# Patient Record
Sex: Male | Born: 1965 | Race: White | Hispanic: No | Marital: Married | Smoking: Never smoker
Health system: Southern US, Community
[De-identification: ages and names within clinical notes are randomized; demographics above are authoritative.]

## PROBLEM LIST (undated history)

## (undated) DIAGNOSIS — L80 Vitiligo: Secondary | ICD-10-CM

## (undated) HISTORY — DX: Vitiligo: L80

---

## 2007-06-03 ENCOUNTER — Encounter: Payer: Self-pay | Admitting: Family Medicine

## 2007-06-08 ENCOUNTER — Ambulatory Visit: Payer: Self-pay | Admitting: Family Medicine

## 2007-06-08 DIAGNOSIS — M25579 Pain in unspecified ankle and joints of unspecified foot: Secondary | ICD-10-CM

## 2007-06-08 DIAGNOSIS — L80 Vitiligo: Secondary | ICD-10-CM

## 2007-09-06 ENCOUNTER — Ambulatory Visit: Payer: Self-pay | Admitting: Family Medicine

## 2007-09-24 ENCOUNTER — Ambulatory Visit: Payer: Self-pay | Admitting: Family Medicine

## 2007-09-30 LAB — CONVERTED CEMR LAB
AST: 20 units/L (ref 0–37)
Basophils Absolute: 0 10*3/uL (ref 0.0–0.1)
Basophils Relative: 0.2 % (ref 0.0–1.0)
Bilirubin, Direct: 0.1 mg/dL (ref 0.0–0.3)
CO2: 33 meq/L — ABNORMAL HIGH (ref 19–32)
Calcium: 9.4 mg/dL (ref 8.4–10.5)
Chloride: 106 meq/L (ref 96–112)
Direct LDL: 136.9 mg/dL
Eosinophils Relative: 1.9 % (ref 0.0–5.0)
GFR calc non Af Amer: 88 mL/min
Glucose, Bld: 92 mg/dL (ref 70–99)
Hemoglobin: 14.9 g/dL (ref 13.0–17.0)
Monocytes Absolute: 0.4 10*3/uL (ref 0.2–0.7)
Monocytes Relative: 8.1 % (ref 3.0–11.0)
Neutrophils Relative %: 50.4 % (ref 43.0–77.0)
Platelets: 244 10*3/uL (ref 150–400)
RDW: 12.8 % (ref 11.5–14.6)
Total Bilirubin: 0.8 mg/dL (ref 0.3–1.2)
Total Protein: 6.7 g/dL (ref 6.0–8.3)

## 2008-08-11 ENCOUNTER — Ambulatory Visit: Payer: Self-pay | Admitting: Family Medicine

## 2008-08-11 ENCOUNTER — Telehealth (INDEPENDENT_AMBULATORY_CARE_PROVIDER_SITE_OTHER): Payer: Self-pay | Admitting: *Deleted

## 2008-08-11 DIAGNOSIS — M79609 Pain in unspecified limb: Secondary | ICD-10-CM

## 2008-09-22 ENCOUNTER — Encounter: Admission: RE | Admit: 2008-09-22 | Discharge: 2008-10-21 | Payer: Self-pay | Admitting: Sports Medicine

## 2009-01-19 ENCOUNTER — Telehealth (INDEPENDENT_AMBULATORY_CARE_PROVIDER_SITE_OTHER): Payer: Self-pay | Admitting: *Deleted

## 2009-01-26 ENCOUNTER — Ambulatory Visit: Payer: Self-pay | Admitting: Family Medicine

## 2009-02-03 ENCOUNTER — Encounter (INDEPENDENT_AMBULATORY_CARE_PROVIDER_SITE_OTHER): Payer: Self-pay | Admitting: *Deleted

## 2009-04-02 ENCOUNTER — Ambulatory Visit: Payer: Self-pay | Admitting: Family Medicine

## 2009-04-02 LAB — CONVERTED CEMR LAB
Blood in Urine, dipstick: NEGATIVE
Ketones, urine, test strip: NEGATIVE
Protein, U semiquant: 30
Specific Gravity, Urine: 1.02
WBC Urine, dipstick: NEGATIVE
pH: 6.5

## 2010-01-24 IMAGING — CR DG FOOT COMPLETE 3+V*L*
3 series · 3 of 3 positions shown · non-contrast
Comparison: None

CLINICAL DATA: Lateral pain

LEFT FOOT - COMPLETE 3+ VIEW

[view not recorded (1 of 3)]
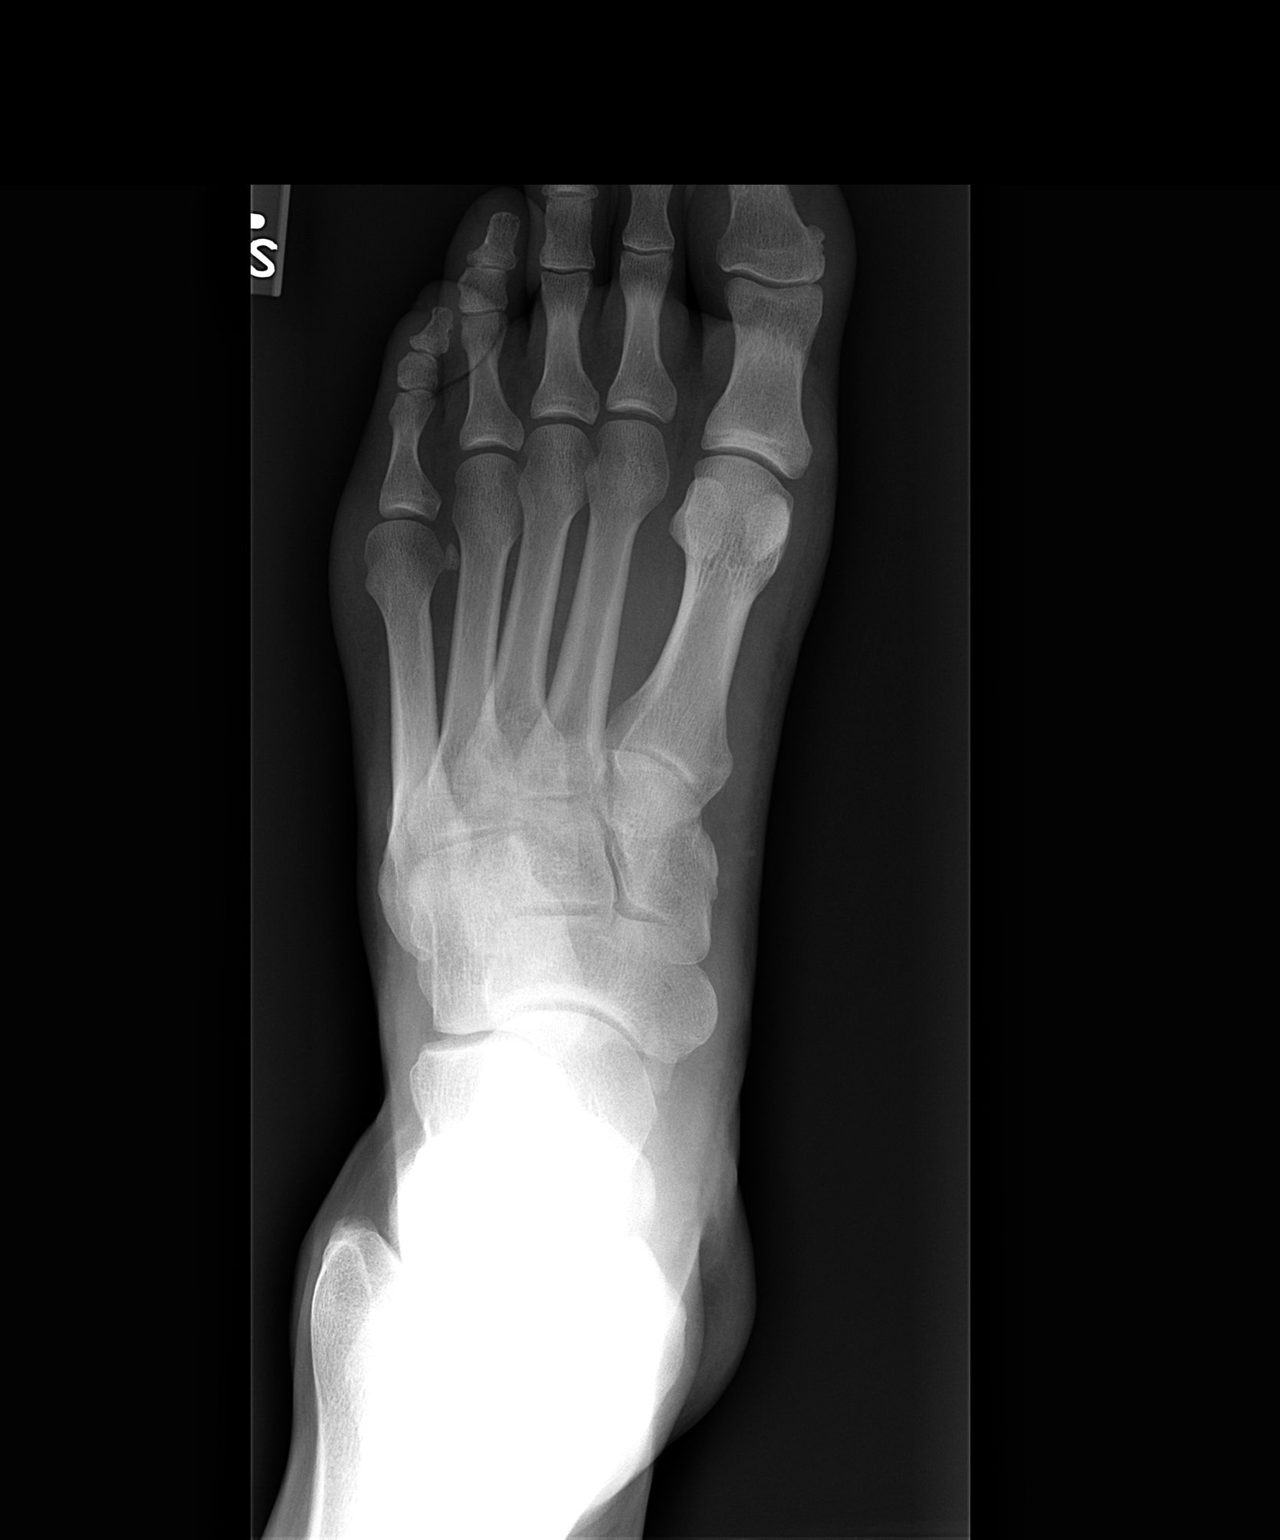

[view not recorded (2 of 3)]
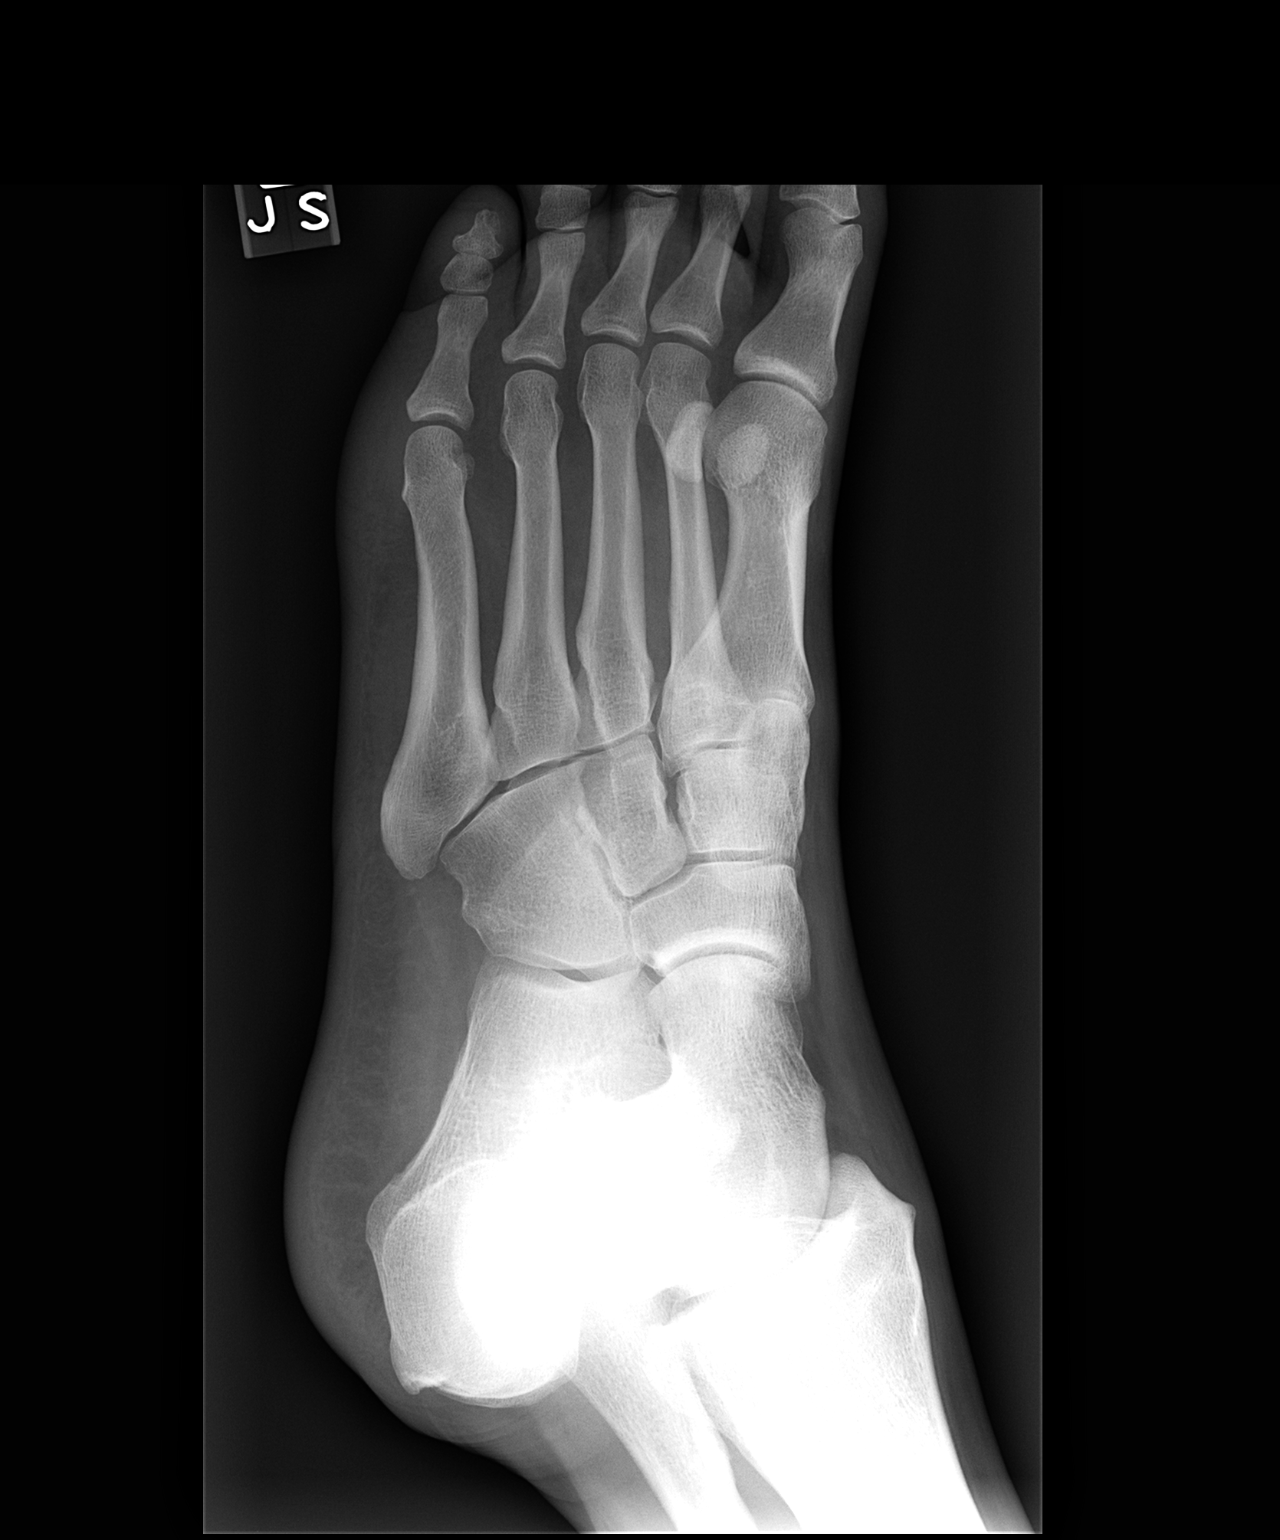

[view not recorded (3 of 3)]
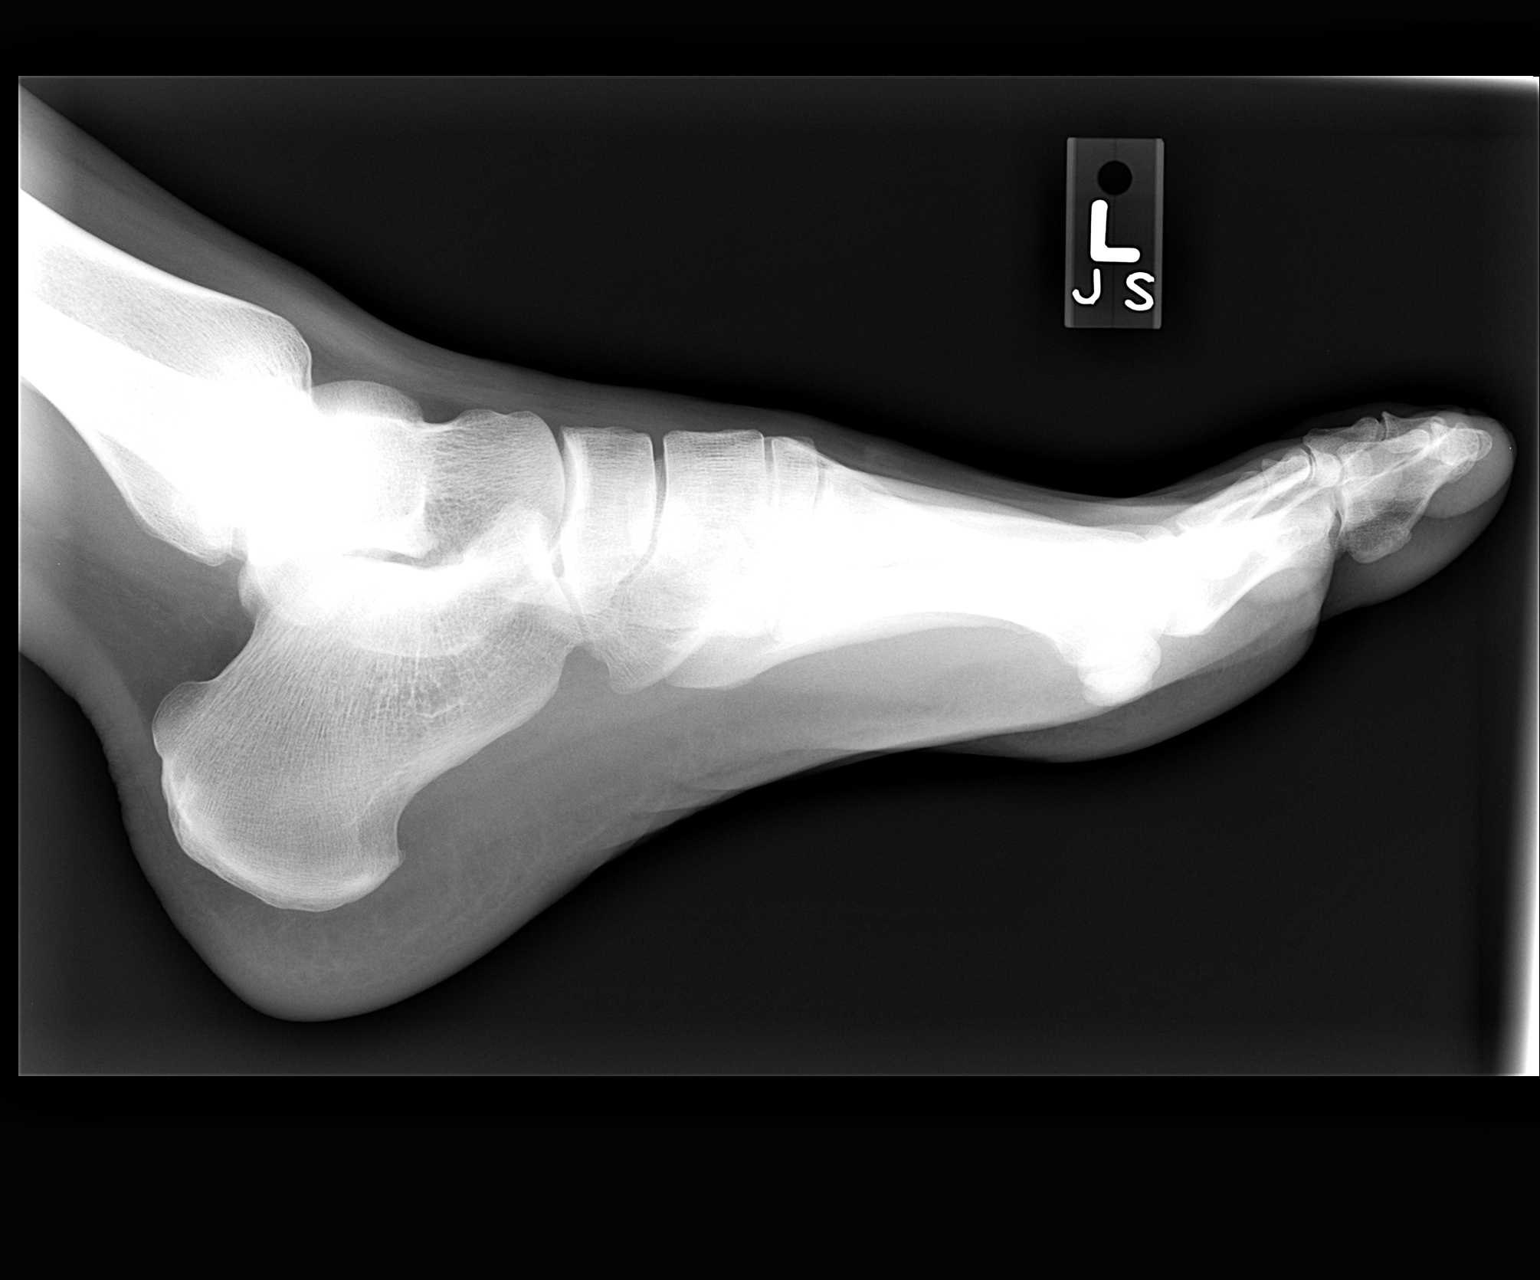

[3 of 3 positions shown; findings below may reference images not displayed]

FINDINGS: There is no evidence of fracture or dislocation.  There
is no evidence of arthropathy or other focal bone abnormality.
Soft tissues are unremarkable.
IMPRESSION: Negative.

## 2010-01-24 IMAGING — CR DG ANKLE COMPLETE 3+V*R*
3 series · 3 of 3 positions shown · non-contrast
Comparison: None

CLINICAL DATA: Medial right ankle pain

RIGHT ANKLE - COMPLETE 3+ VIEW

[view not recorded (1 of 3)]
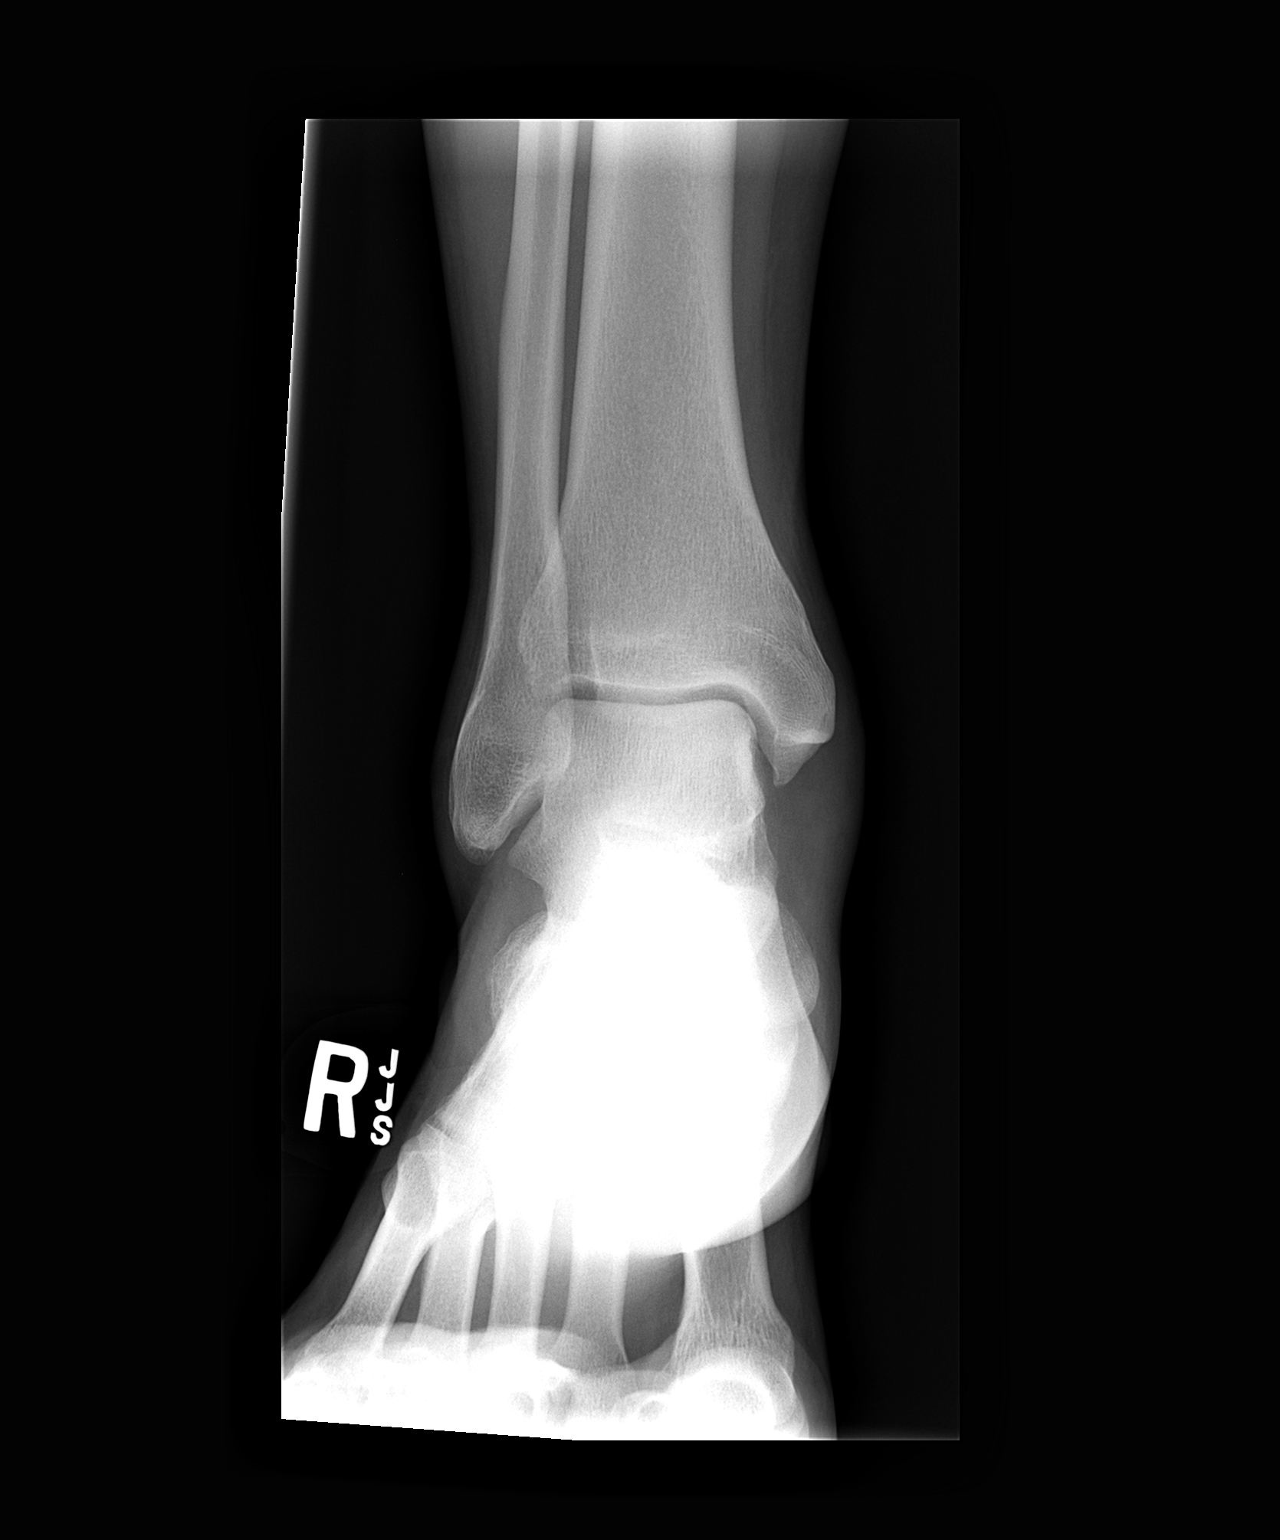

[view not recorded (2 of 3)]
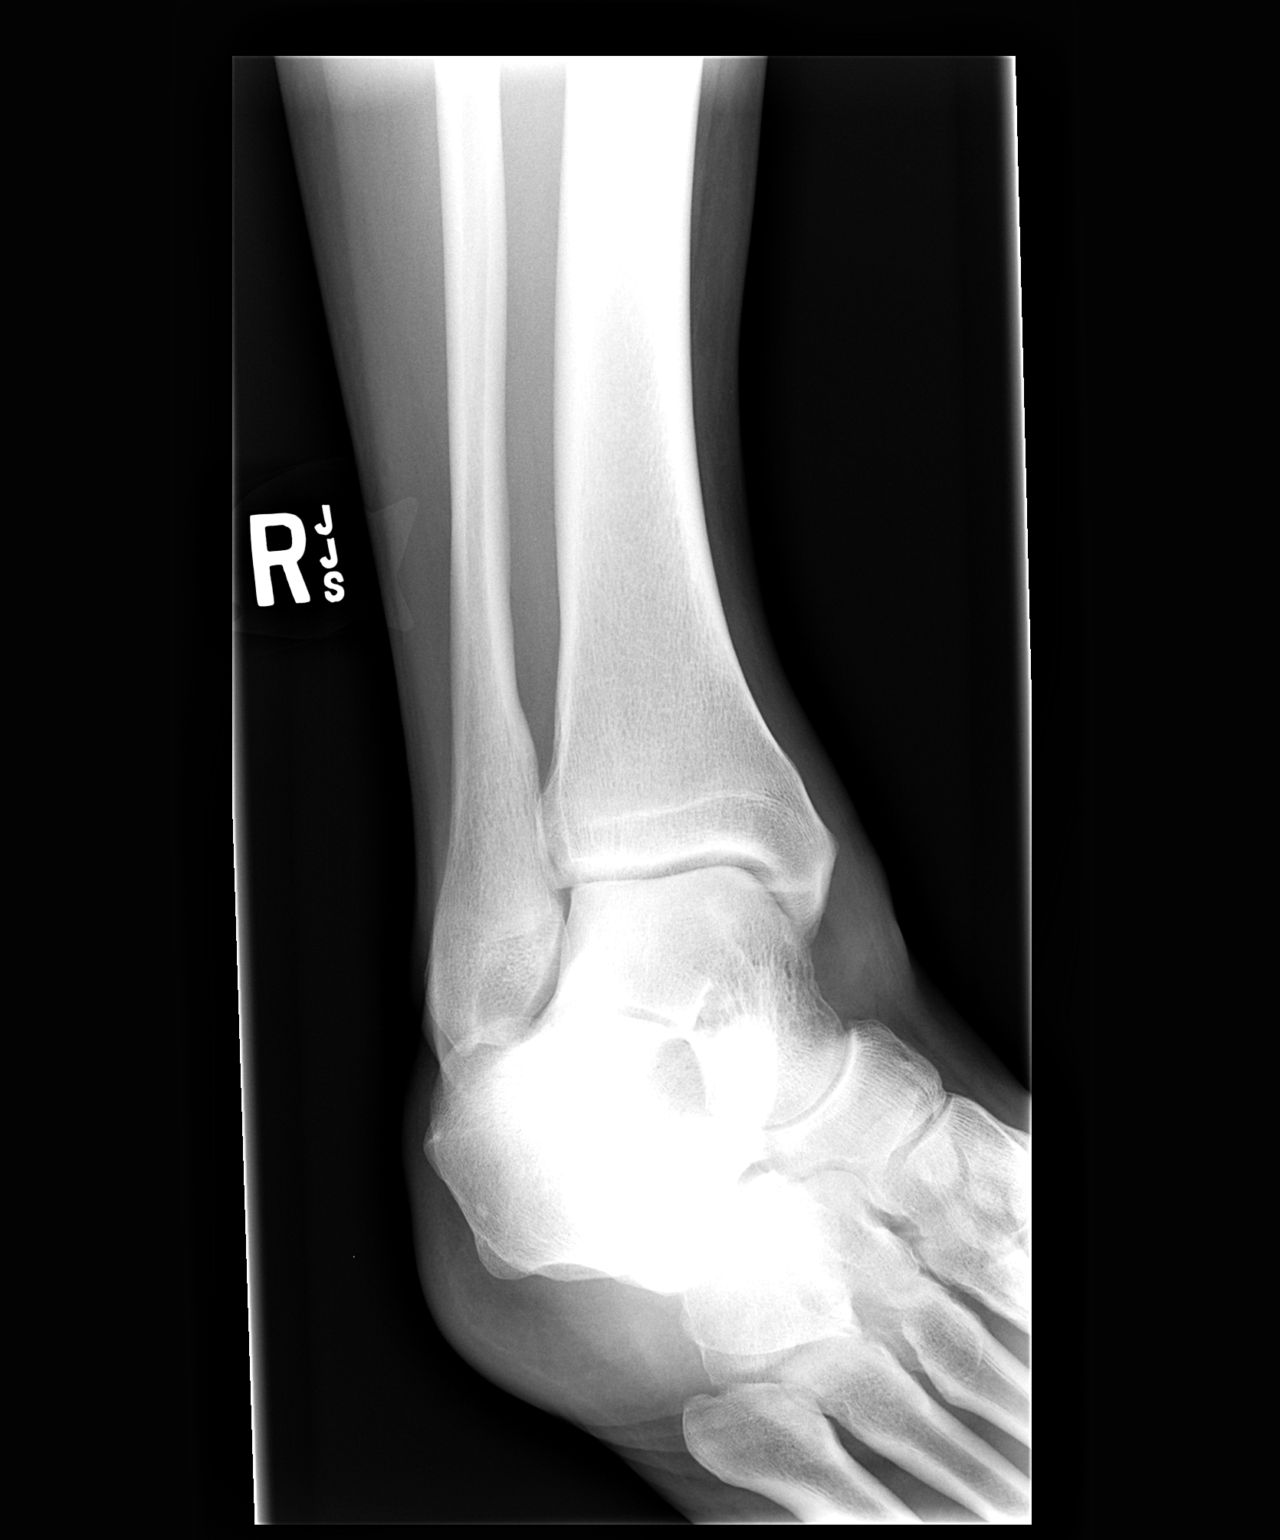

[view not recorded (3 of 3)]
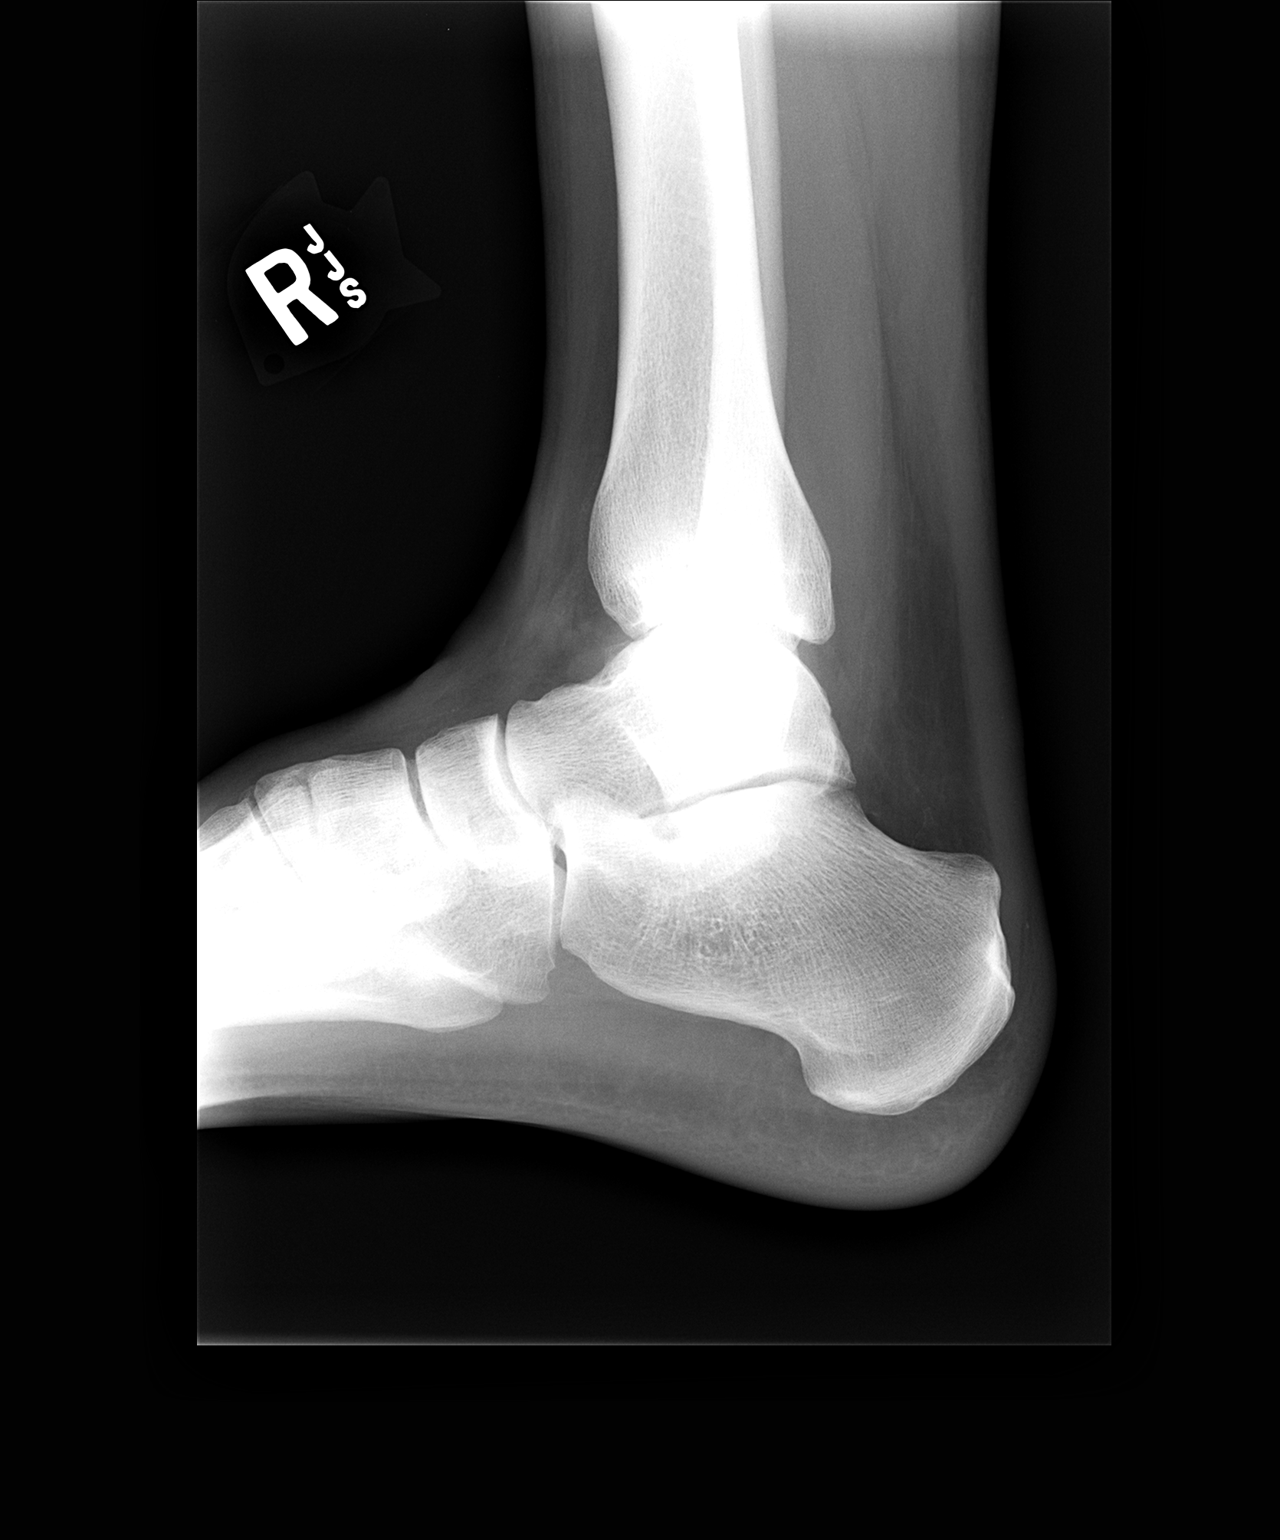

[3 of 3 positions shown; findings below may reference images not displayed]

FINDINGS: There is no evidence of fracture, dislocation or joint
effusion.  There is no evidence of arthropathy or other focal bone
abnormality.  Soft tissues are unremarkable.
IMPRESSION: Negative.

## 2010-08-20 ENCOUNTER — Ambulatory Visit: Payer: Self-pay | Admitting: Family Medicine

## 2010-08-20 ENCOUNTER — Encounter: Payer: Self-pay | Admitting: Family Medicine

## 2010-08-20 DIAGNOSIS — R002 Palpitations: Secondary | ICD-10-CM

## 2010-08-23 LAB — CONVERTED CEMR LAB
AST: 24 units/L (ref 0–37)
Alkaline Phosphatase: 53 units/L (ref 39–117)
BUN: 16 mg/dL (ref 6–23)
Bilirubin, Direct: 0.1 mg/dL (ref 0.0–0.3)
Cholesterol: 198 mg/dL (ref 0–200)
Creatinine, Ser: 0.8 mg/dL (ref 0.4–1.5)
GFR calc non Af Amer: 108.89 mL/min (ref >60)
Glucose, Bld: 83 mg/dL (ref 70–99)
LDL Cholesterol: 125 mg/dL — ABNORMAL HIGH (ref 0–99)
Lymphocytes Relative: 30.8 % (ref 12.0–46.0)
MCV: 99.9 fL (ref 78.0–100.0)
Monocytes Absolute: 0.5 10*3/uL (ref 0.1–1.0)
Neutro Abs: 3.2 10*3/uL (ref 1.4–7.7)
Total Bilirubin: 1.2 mg/dL (ref 0.3–1.2)
Total Protein: 6.8 g/dL (ref 6.0–8.3)
VLDL: 9.8 mg/dL (ref 0.0–40.0)

## 2010-08-25 ENCOUNTER — Encounter: Payer: Self-pay | Admitting: Family Medicine

## 2010-08-25 ENCOUNTER — Ambulatory Visit: Payer: Self-pay

## 2010-08-25 ENCOUNTER — Ambulatory Visit: Payer: Self-pay | Admitting: Cardiology

## 2010-08-25 ENCOUNTER — Ambulatory Visit (HOSPITAL_COMMUNITY): Admission: RE | Admit: 2010-08-25 | Discharge: 2010-08-25 | Payer: Self-pay | Admitting: Family Medicine

## 2010-08-27 ENCOUNTER — Ambulatory Visit: Payer: Self-pay | Admitting: Cardiology

## 2010-11-23 NOTE — Assessment & Plan Note (Signed)
Summary: 6 months of irregular heartbeat at night/no pain/kn   Vital Signs:  Patient profile:   45 year old male Height:      72 inches Weight:      208.0 pounds BMI:     28.31 Pulse rate:   56 / minute Pulse rhythm:   regular BP sitting:   130 / 82  (right arm) Cuff size:   regular  Vitals Entered By: Almeta Monas CMA Duncan Dull) (August 20, 2010 9:29 AM) CC: c/o irregular heartbeat x 6mos   History of Present Illness: Pt here c/o greater than 6 months of irregular hearbeat that usually last about 15 -20 minutes but last night it lasted longer and he felt a "thud" when it because regular again.      Current Medications (verified): 1)  Mobic 15 Mg Tabs (Meloxicam) .Marland Kitchen.. 1 By Mouth Once Daily As Needed  Allergies (verified): No Known Drug Allergies  Past History:  Past Medical History: Last updated: 2007-09-09 Viteligo + PPD  Past Surgical History: Last updated: 06/08/2007 Denies surgical history  Family History: Last updated: Sep 09, 2007 MOTHER:DECEASED-BRAIN ANURESYUM FATHER;LIVING 1 SISTER:LIVING Family History Hypertension:MOTHER, SISTER CANCER:AUNT-MOTHER'S SIDE  Family History Depression: MOTHER, SISTER? PGM--old age,  Viviann Spare---- 44 yo  Social History: Last updated: 06/08/2007 Occupation: Investment banker, operational at [employer]  Married with step son Never Smoked Alcohol use-yes Drug use-no Regular exercise-yes  Risk Factors: Caffeine Use: 5+ (06/08/2007) Exercise: yes (06/08/2007)  Risk Factors: Smoking Status: never (06/08/2007) Passive Smoke Exposure: no (06/08/2007)  Family History: Reviewed history from 09-Sep-2007 and no changes required. MOTHER:DECEASED-BRAIN ANURESYUM FATHER;LIVING 1 SISTER:LIVING Family History Hypertension:MOTHER, SISTER CANCER:AUNT-MOTHER'S SIDE  Family History Depression: MOTHER, SISTER? PGM--old age,  Viviann Spare---- 44 yo  Social History: Reviewed history from 06/08/2007 and no changes required. Occupation: Investment banker, operational at [employer]  Married  with step son Never Smoked Alcohol use-yes Drug use-no Regular exercise-yes  Review of Systems      See HPI  Physical Exam  General:  Well-developed,well-nourished,in no acute distress; alert,appropriate and cooperative throughout examination Neck:  No deformities, masses, or tenderness noted. Lungs:  Normal respiratory effort, chest expands symmetrically. Lungs are clear to auscultation, no crackles or wheezes. Heart:  normal rate and no murmur.   Psych:  Oriented X3 and normally interactive.     Impression & Recommendations:  Problem # 1:  PALPITATIONS, RECURRENT (ICD-785.1)  Orders: Venipuncture (01027) TLB-BMP (Basic Metabolic Panel-BMET) (80048-METABOL) TLB-CBC Platelet - w/Differential (85025-CBCD) TLB-TSH (Thyroid Stimulating Hormone) (84443-TSH) TLB-Lipid Panel (80061-LIPID) TLB-Hepatic/Liver Function Pnl (80076-HEPATIC) Echo Referral (Echo) Cardiology Referral (Cardiology) EKG w/ Interpretation (93000)  Avoid caffeine, chocolate, and stimulants. Stress reduction as well as medication options discussed.   Complete Medication List: 1)  Mobic 15 Mg Tabs (Meloxicam) .Marland Kitchen.. 1 by mouth once daily as needed  Patient Instructions: 1)  If symptoms reoccur go to er   Orders Added: 1)  Venipuncture [36415] 2)  TLB-BMP (Basic Metabolic Panel-BMET) [80048-METABOL] 3)  TLB-CBC Platelet - w/Differential [85025-CBCD] 4)  TLB-TSH (Thyroid Stimulating Hormone) [84443-TSH] 5)  TLB-Lipid Panel [80061-LIPID] 6)  TLB-Hepatic/Liver Function Pnl [80076-HEPATIC] 7)  Echo Referral [Echo] 8)  Cardiology Referral [Cardiology] 9)  Est. Patient Level IV [25366] 10)  EKG w/ Interpretation [93000]

## 2010-11-23 NOTE — Assessment & Plan Note (Signed)
Summary: np6/ echo on 11/2 / recurrent palp. pt has atena. gd   Primary Cody Castro:  Cody Castro  CC:  referal from Dr. Laury Castro palpitations..pt states he has been doing ok since that visit.  History of Present Illness: 45 year old male for evaluation of palpitations. Echocardiogram in November of 2011 showed normal LV function and no significant valvular disease. There was mild biatrial enlargement. TSH and potassium normal in October of 2011. Over the past one year patient has noticed occasional palpitations. They are described as a heartbeat followed by a pause. He does not have sustained palpitations. He otherwise denies dyspnea on exertion, orthopnea, PND, pedal edema, syncope or chest pain. Because of the above we were asked to further evaluate.  Current Medications (verified): 1)  None  Allergies: No Known Drug Allergies  Past History:  Past Medical History: POSITIVE PPD  VITILIGO  Prior duodenal ulcer  Past Surgical History: Reviewed history from 06/08/2007 and no changes required. Denies surgical history  Family History: Reviewed history from 09/06/2007 and no changes required. MOTHER:DECEASED-BRAIN ANURESYUM FATHER;LIVING 1 SISTER:LIVING Family History Hypertension:MOTHER, SISTER CANCER:AUNT-MOTHER'S SIDE  Family History Depression: MOTHER, SISTER? PGM--old age,  Cody Castro---- 8 yo  No premature CAD  Social History: Reviewed history from 06/08/2007 and no changes required. 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 Remaining systems are negative.   Vital Signs:  Patient profile:   45 year old male Height:      72 inches Weight:      204 pounds BMI:     27.77 Pulse rate:   57 /  minute Resp:     14 per minute BP sitting:   113 / 76  (left arm)  Vitals Entered By: Cody Castro (August 27, 2010 3:30 PM)  Physical Exam  General:  Well developed/well nourished in NAD Skin warm/dry Patient not depressed No peripheral clubbing Back-normal HEENT-normal/normal eyelids Neck supple/normal carotid upstroke bilaterally; no bruits; no JVD; no thyromegaly chest - CTA/ normal expansion CV - RRR/normal S1 and S2; no murmurs, rubs or gallops;  PMI nondisplaced Abdomen -NT/ND, no HSM, no mass, + bowel sounds, no bruit 2+ femoral pulses, no bruits Ext-no edema, chords, 2+ DP Neuro-grossly nonfocal     EKG  Procedure date:  08/20/2010  Findings:      Sinus bradycardia with no ST changes.  Impression & Recommendations:  Problem # 1:  PALPITATIONS, RECURRENT (ICD-785.1) Symptoms sound most consistent with PACs or PVCs. LV function and TSH normal. They are self-limited at this point. If they worsen in the future we could consider a monitor and if above documented a beta blocker for symptomatic relief. However he does not wish to pursue this at this point. I have asked him to decrease his caffeine and see if this will improve his symptoms.

## 2010-11-23 NOTE — Miscellaneous (Signed)
Summary: Appointment Canceled  Appointment status changed to canceled by [employer] on 08/20/2010 10:29 AM.  Cancellation Comments --------------------- echo/ recurrent palp. pt has atena/ gd  Appointment Information ----------------------- Appt Type:  CARDIOLOGY ANCILLARY VISIT      Date:  Wednesday, August 25, 2010      Time:  1:00 PM for 60 min   Urgency:  Routine   Made By:  Hoy Finlay Scheduler  To Visit:  [id number]    Reason:  echo/ recurrent palp. pt has atena/ gd  Appt Comments ------------- -- 08/20/10 10:29: (CEM[facility]) CANCELED -- echo/ recurrent palp. pt has atena/ gd -- 08/20/10 10:16: ([facility]EM[facility]) BOOKED -- Routine CARDIOLOGY ANCILLARY VISIT at 08/25/2010 1:00 PM for 60 min echo/ recurrent palp. pt has atena/ gd

## 2010-11-23 NOTE — Miscellaneous (Signed)
Summary: Appointment Canceled  Appointment status changed to canceled by [employer] on 08/20/2010 10:32 AM.  Cancellation Comments --------------------- echo/ recurrent palp. pt has atena/ gd  Appointment Information ----------------------- Appt Type:  CARDIOLOGY ANCILLARY VISIT      Date:  Monday, August 30, 2010      Time:  4:00 PM for 60 min   Urgency:  Routine   Made By:  Pearson Grippe  To Visit:  [id number]    Reason:  echo/ recurrent palp. pt has atena/ gd  Appt Comments ------------- -- 08/20/10 10:32: (CEMR) CANCELED -- echo/ recurrent palp. pt has atena/ gd -- 08/20/10 10:29: ([facility]) BOOKED -- Routine CARDIOLOGY ANCILLARY VISIT at 08/30/2010 4:00 PM for 60 min echo/ recurrent palp. pt has atena/ gd

## 2010-11-23 NOTE — Miscellaneous (Signed)
Summary: Appointment Canceled  Appointment status changed to canceled by [employer] on 08/20/2010 10:39 AM.  Cancellation Comments --------------------- echo/ recurrent palp. pt has atena/ gd  Appointment Information ----------------------- Appt Type:  CARDIOLOGY ANCILLARY VISIT      Date:  Wednesday, August 25, 2010      Time:  3:00 PM for 60 min   Urgency:  Routine   Made By:  Hoy Finlay Scheduler  To Visit:  [id number]    Reason:  echo/ recurrent palp. pt has atena/ gd  Appt Comments ------------- -- 08/20/10 10:39: (CEMR) CANCELED -- echo/ recurrent palp. pt has atena/ gd -- 08/20/10 10:32: (CEM[facility]) BOOKED -- Routine CARDIOLOGY ANCILLARY VISIT at 08/25/2010 3:00 PM for 60 min echo/ recurrent palp. pt has atena/ gd

## 2011-11-15 ENCOUNTER — Encounter: Payer: Self-pay | Admitting: Family Medicine

## 2011-11-15 ENCOUNTER — Ambulatory Visit (INDEPENDENT_AMBULATORY_CARE_PROVIDER_SITE_OTHER): Payer: 59 | Admitting: Family Medicine

## 2011-11-15 VITALS — BP 120/72 | HR 57 | Temp 97.8°F | Ht 72.0 in | Wt 219.4 lb

## 2011-11-15 DIAGNOSIS — Z0000 Encounter for general adult medical examination without abnormal findings: Secondary | ICD-10-CM

## 2011-11-15 DIAGNOSIS — J329 Chronic sinusitis, unspecified: Secondary | ICD-10-CM

## 2011-11-15 LAB — POCT URINALYSIS DIPSTICK
Leukocytes, UA: NEGATIVE
Protein, UA: NEGATIVE
Urobilinogen, UA: 0.2

## 2011-11-15 LAB — HEPATIC FUNCTION PANEL
AST: 17 U/L (ref 0–37)
Albumin: 4 g/dL (ref 3.5–5.2)
Alkaline Phosphatase: 56 U/L (ref 39–117)
Total Bilirubin: 0.8 mg/dL (ref 0.3–1.2)

## 2011-11-15 LAB — BASIC METABOLIC PANEL
CO2: 28 mEq/L (ref 19–32)
Calcium: 9.1 mg/dL (ref 8.4–10.5)
GFR: 105.3 mL/min (ref >60.00)
Glucose, Bld: 94 mg/dL (ref 70–99)
Sodium: 139 mEq/L (ref 135–145)

## 2011-11-15 LAB — CBC WITH DIFFERENTIAL/PLATELET
Basophils Absolute: 0 10*3/uL (ref 0.0–0.1)
Hemoglobin: 14.4 g/dL (ref 13.0–17.0)
Lymphocytes Relative: 35.6 % (ref 12.0–46.0)
Monocytes Relative: 8.6 % (ref 3.0–12.0)
Neutro Abs: 2.8 10*3/uL (ref 1.4–7.7)
RBC: 4.31 Mil/uL (ref 4.22–5.81)
RDW: 13.1 % (ref 11.5–14.6)
WBC: 5.3 10*3/uL (ref 4.5–10.5)

## 2011-11-15 LAB — LIPID PANEL
LDL Cholesterol: 122 mg/dL — ABNORMAL HIGH (ref 0–99)
VLDL: 9.8 mg/dL (ref 0.0–40.0)

## 2011-11-15 LAB — PSA: PSA: 0.96 ng/mL (ref 0.10–4.00)

## 2011-11-15 MED ORDER — CEFUROXIME AXETIL 500 MG PO TABS: 500.0000 mg | ORAL_TABLET | Freq: Two times a day (BID) | ORAL | Status: AC

## 2011-11-15 NOTE — Progress Notes (Signed)
Subjective:    Patient ID: Cody Castro, male    DOB: [birth year], 46 y.o.   MRN: [id number]  HPI  Pt here for cpe and labs.  Pt c/o sinus congestion for several months--since Thanksgiving. + sinus pressure and congestion,  No fevers.  No coughing.  Past Medical History  Diagnosis Date  . Vitiligo    History   Social History  . Marital Status: Married    Spouse Name: N/A    Number of Children: N/A  . Years of Education: N/A   Occupational History  . [employer]  w/s    Social History Main Topics  . Smoking status: Never Smoker   . Smokeless tobacco: Never Used  . Alcohol Use: 7.0 oz/week    14 drink(s) per week  . Drug Use: No  . Sexually Active: Yes -- Male partner(s)   Other Topics Concern  . Not on file   Social History Narrative   Exercise--  Walking/ jogging   Family History  Problem Relation Age of Onset  . Hypertension Mother   . Depression Mother   . Depression Sister   . Hypertension Sister   . Cancer Maternal Aunt 30    breast  . Heart disease Paternal Grandfather   . Parkinsonism Paternal Grandfather   . Alzheimer's disease Paternal Grandfather   . Deep vein thrombosis Paternal Grandfather      Review of Systems Review of Systems  Constitutional: Negative for activity change, appetite change and fatigue.  HENT: Negative for hearing loss tinnitus and ear discharge.  dentist-- due----+ congestion Eyes: Negative for visual disturbance (see optho---- no) Respiratory: Negative for cough, chest tightness and shortness of breath.   Cardiovascular: Negative for chest pain, palpitations and leg swelling.  Gastrointestinal: Negative for abdominal pain, diarrhea, constipation and abdominal distention.  Genitourinary: Negative for urgency, frequency, decreased urine volume and difficulty urinating.  Musculoskeletal: Negative for back pain, arthralgias and gait problem.  Skin: Negative for color change, pallor and rash.  Neurological: Negative for  dizziness, light-headedness, numbness and headaches.  Hematological: Negative for adenopathy. Does not bruise/bleed easily.  Psychiatric/Behavioral: Negative for suicidal ideas, confusion, sleep disturbance, self-injury, dysphoric mood, decreased concentration and agitation.         Objective:   Physical Exam BP 120/72  Pulse 57  Temp(Src) 97.8 F (36.6 C) (Oral)  Ht 6' (1.829 m)  Wt 219 lb 6.4 oz (99.519 kg)  BMI 29.76 kg/m2  SpO2 98%  General Appearance:    Alert, cooperative, no distress, appears stated age  Head:    Normocephalic, without obvious abnormality, atraumatic  Eyes:    PERRL, conjunctiva/corneas clear, EOM's intact, fundi    benign, both eyes       Ears:    Normal TM's and external ear canals, both ears  Nose:   + turb errythematous and swollen,  + sinus tenderness R side  Throat:   Lips, mucosa, and tongue normal; teeth and gums normal  Neck:   Supple, symmetrical, trachea midline, no adenopathy;       thyroid:  No enlargement/tenderness/nodules; no carotid   bruit or JVD  Back:     Symmetric, no curvature, ROM normal, no CVA tenderness  Lungs:     Clear to auscultation bilaterally, respirations unlabored  Chest wall:    No tenderness or deformity  Heart:    Regular rate and rhythm, S1 and S2 normal, no murmur, rub   or gallop  Abdomen:     Soft, non-tender, bowel sounds  active all four quadrants,    no masses, no organomegaly  Genitalia:    Normal male without lesion, discharge or tenderness  Rectal:    Normal tone, normal prostate, no masses or tenderness;   guaiac negative stool  Extremities:   Extremities normal, atraumatic, no cyanosis or edema  Pulses:   2+ and symmetric all extremities  Skin:   Skin color, texture, turgor normal, no rashes or lesions  Lymph nodes:   Cervical, supraclavicular, and axillary nodes normal  Neurologic:   CNII-XII intact. Normal strength, sensation and reflexes      throughout         Assessment & Plan:  cpe-- ghm  utd           Check fasting labs  viteligo--- per derm Sinusitis---ceftin, qnsal                  Call prn

## 2011-11-15 NOTE — Patient Instructions (Signed)
Preventative Care for Adults, Male A healthy lifestyle and preventative care can promote health and wellness. Preventative health guidelines for men include the following key practices:  A routine yearly physical is a good way to check with your caregiver about your health and preventative screening. It is a chance to share any concerns and updates on your health, and to receive a thorough exam.   Visit your dentist for a routine exam and preventative care every 6 months. Brush your teeth twice a day and floss once a day. Good oral hygiene prevents tooth decay and gum disease.   The frequency of eye exams is based on your age, health, family medical history, use of contact lenses, and other factors. Follow your caregiver's recommendations for frequency of eye exams.   Eat a healthy diet. Foods like vegetables, fruits, whole grains, low-fat dairy products, and lean protein foods contain the nutrients you need without too many calories. Decrease your intake of foods high in solid fats, added sugars, and salt. Eat the right amount of calories for you.Get information about a proper diet from your caregiver, if necessary.   Regular physical exercise is one of the most important things you can do for your health. Most adults should get at least 150 minutes of moderate-intensity exercise (any activity that increases your heart rate and causes you to sweat) each week. In addition, most adults need muscle-strengthening exercises on 2 or more days a week.   Maintain a healthy weight. The body mass index (BMI) is a screening tool to identify possible weight problems. It provides an estimate of body fat based on height and weight. Your caregiver can help determine your BMI, and can help you achieve or maintain a healthy weight.For adults 20 years and older:   A BMI below 18.5 is considered underweight.   A BMI of 18.5 to 24.9 is normal.   A BMI of 25 to 29.9 is considered overweight.   A BMI of 30 and  above is considered obese.   Maintain normal blood lipids and cholesterol levels by exercising and minimizing your intake of saturated fat. Eat a balanced diet with plenty of fruit and vegetables. Blood tests for lipids and cholesterol should begin at age 20 and be repeated every 5 years. If your lipid or cholesterol levels are high, you are over 50, or you are a high risk for heart disease, you may need your cholesterol levels checked more frequently.Ongoing high lipid and cholesterol levels should be treated with medicines if diet and exercise are not effective.   If you smoke, find out from your caregiver how to quit. If you do not use tobacco, do not start.   If you choose to drink alcohol, do not exceed 2 drinks per day. One drink is considered to be 12 ounces (355 mL) of beer, 5 ounces (148 mL) of wine, or 1.5 ounces (44 mL) of liquor.   Avoid use of street drugs. Do not share needles with anyone. Ask for help if you need support or instructions about stopping the use of drugs.   High blood pressure causes heart disease and increases the risk of stroke. Your blood pressure should be checked at least every 1 to 2 years. Ongoing high blood pressure should be treated with medicines, if weight loss and exercise are not effective.   If you are 45 to 45 years old, ask your caregiver if you should take aspirin to prevent heart disease.   Diabetes screening involves taking a blood   sample to check your fasting blood sugar level. This should be done once every 3 years, after age 45, if you are within normal weight and without risk factors for diabetes. Testing should be considered at a younger age or be carried out more frequently if you are overweight and have at least 1 risk factor for diabetes.   Colorectal cancer can be detected and often prevented. Most routine colorectal cancer screening begins at the age of 50 and continues through age 75. However, your caregiver may recommend screening at an  earlier age if you have risk factors for colon cancer. On a yearly basis, your caregiver may provide home test kits to check for hidden blood in the stool. Use of a small camera at the end of a tube, to directly examine the colon (sigmoidoscopy or colonoscopy), can detect the earliest forms of colorectal cancer. Talk to your caregiver about this at age 50, when routine screening begins. Direct examination of the colon should be repeated every 5 to 10 years through age 75, unless early forms of pre-cancerous polyps or small growths are found.   Practice safe sex. Use condoms and avoid high-risk sexual practices to reduce the spread of sexually transmitted infections (STIs). STIs include gonorrhea, chlamydia, syphilis, trichomonas, herpes, HPV, and human immunodeficiency virus (HIV). Herpes, HIV, and HPV are viral illnesses that have no cure. They can result in disability, cancer, and death.   A one-time screening for abdominal aortic aneurysm (AAA) and surgical repair of large AAAs by sound wave imaging (ultrasonography) is recommended for ages 65 to 75 years who are current or former smokers.   Healthy men should no longer receive prostate-specific antigen (PSA) blood tests as part of routine cancer screening. Consult with your caregiver about prostate cancer screening.   Use sunscreen with skin protection factor (SPF) of 30 or more. Apply sunscreen liberally and repeatedly throughout the day. You should seek shade when your shadow is shorter than you. Protect yourself by wearing long sleeves, pants, a wide-brimmed hat, and sunglasses year round, whenever you are outdoors.   Once a month, do a whole body skin exam, using a mirror to look at the skin on your back. Notify your caregiver of new moles, moles that have irregular borders, moles that are larger than a pencil eraser, or moles that have changed in shape or color.   Stay current with required immunizations.   Influenza. You need a dose every  fall (or winter). The composition of the flu vaccine changes each year, so being vaccinated once is not enough.   Pneumococcal polysaccharide. You need 1 to 2 doses if you smoke cigarettes or if you have certain chronic medical conditions. You need 1 dose at age 65 (or older) if you have never been vaccinated.   Tetanus, diphtheria, pertussis (Tdap, Td). Get 1 dose of Tdap vaccine if you are younger than age 65 years, are over 65 and have contact with an infant, are a healthcare worker, or simply want to be protected from whooping cough. After that, you need a Td booster dose every 10 years. Consult your caregiver if you have not had at least 3 tetanus and diphtheria-containing shots sometime in your life or have a deep or dirty wound.   HPV. This vaccine is recommended for males 13 through 45 years of age. This vaccine may be given to men 22 through 45 years of age who have not completed the 3 dose series. It is recommended for men through age 26   who have sex with men or whose immune system is weakened because of HIV infection, other illness, or medications. The vaccine is given in 3 doses over 6 months.   Measles, mumps, rubella (MMR). You need at least 1 dose of MMR if you were born in 1957 or later. You may also need a 2nd dose.   Meningococcal. If you are age 19 to 21 years and a first-year college student living in a residence hall, or have one of several medical conditions, you need to get vaccinated against meningococcal disease. You may also need additional booster doses.   Zoster (shingles). If you are age 60 years or older, you should get this vaccine.   Varicella (chickenpox). If you have never had chickenpox or you were vaccinated but received only 1 dose, talk to your caregiver to find out if you need this vaccine.   Hepatitis A. You need this vaccine if you have a specific risk factor for hepatitis A virus infection, or you simply wish to be protected from this disease. The vaccine is  usually given as 2 doses, 6 to 18 months apart.   Hepatitis B. You need this vaccine if you have a specific risk factor for hepatitis B virus infection or you simply wish to be protected from this disease. The vaccine is given in 3 doses, usually over 6 months.  Preventative Service / Frequency Ages 19 to 39  Blood pressure check.** / Every 1 to 2 years.   Lipid and cholesterol check.**/ Every 5 years beginning at age 20.   Skin self-exam. / Monthly.   Influenza immunization.** / Every year.   Pneumococcal polysaccharide immunization.** / 1 to 2 doses if you smoke cigarettes or if you have certain chronic medical conditions.   Tetanus, diphtheria, pertussis (Tdap,Td) immunization. / A one-time dose of Tdap vaccine. After that, you need a Td booster dose every 10 years.   HPV immunization. / 3 doses over 6 months, if 26 and younger.   Measles, mumps, rubella (MMR) immunization. / You need at least 1 dose of MMR if you were born in 1957 or later. You may also need a 2nd dose.   Meningococcal immunization. / 1 dose if you are age 19 to 21 years and a first-year college student living in a residence hall, or have one of several medical conditions, you need to get vaccinated against meningococcal disease. You may also need additional booster doses.   Varicella immunization. **/ Consult your caregiver.   Hepatitis A immunization. ** / Consult your caregiver. 2 doses, 6 to 18 months apart.   Hepatitis B immunization.** / Consult your caregiver. 3 doses usually over 6 months.  Ages 40 to 64  Blood pressure check.** / Every 1 to 2 years.   Lipid and cholesterol check.**/ Every 5 years beginning at age 20.   Fecal occult blood test (FOBT) of stool. / Every year beginning at age 50 and continuing until age 75. You may not have to do this test if you get colonoscopy every 10 years.   Flexible sigmoidoscopy** or colonoscopy.** / Every 5 years for a flexible sigmoidoscopy or every 10 years for  a colonoscopy beginning at age 50 and continuing until age 75.   Skin self-exam. / Monthly.   Influenza immunization.** / Every year.   Pneumococcal polysaccharide immunization.** / 1 to 2 doses if you smoke cigarettes or if you have certain chronic medical conditions.   Tetanus, diphtheria, pertussis (Tdap/Td) immunization.** / A one-time dose of   Tdap vaccine. After that, you need a Td booster dose every 10 years.   Measles, mumps, rubella (MMR) immunization. / You need at least 1 dose of MMR if you were born in 1957 or later. You may also need a 2nd dose.   Varicella immunization. **/ Consult your caregiver.   Meningococcal immunization.** / Consult your caregiver.   Hepatitis A immunization. ** / Consult your caregiver. 2 doses, 6 to 18 months apart.   Hepatitis B immunization.** / Consult your caregiver. 3 doses, usually over 6 months.  Ages 65 and over  Blood pressure check.** / Every 1 to 2 years.   Lipid and cholesterol check.**/ Every 5 years beginning at age 20.   Fecal occult blood test (FOBT) of stool. / Every year beginning at age 50 and continuing until age 75. You may not have to do this test if you get colonoscopy every 10 years.   Flexible sigmoidoscopy** or colonoscopy.** / Every 5 years for a flexible sigmoidoscopy or every 10 years for a colonoscopy beginning at age 50 and continuing until age 75.   Abdominal aortic aneurysm (AAA) screening.** / A one-time screening for ages 65 to 75 years who are current or former smokers.   Skin self-exam. / Monthly.   Influenza immunization.** / Every year.   Pneumococcal polysaccharide immunization.** / 1 dose at age 65 (or older) if you have never been vaccinated.   Tetanus, diphtheria, pertussis (Tdap, Td) immunization. / A one-time dose of Tdap vaccine if you are over 65 and have contact with an infant, are a healthcare worker, or simply want to be protected from whooping cough. After that, you need a Td booster dose  every 10 years.   Varicella immunization. **/ Consult your caregiver.   Meningococcal immunization.** / Consult your caregiver.   Hepatitis A immunization. ** / Consult your caregiver. 2 doses, 6 to 18 months apart.   Hepatitis B immunization.** / Check with your caregiver. 3 doses, usually over 6 months.  **Family history and personal history of risk and conditions may change your caregiver's recommendations. Document Released: 12/06/2001 Document Revised: 06/22/2011 Document Reviewed: 03/07/2011 [employer] Patient Information 2012 [employer]. 

## 2011-11-21 ENCOUNTER — Encounter: Payer: Self-pay | Admitting: Family Medicine

## 2012-12-08 ENCOUNTER — Other Ambulatory Visit: Payer: Self-pay

## 2013-08-29 ENCOUNTER — Other Ambulatory Visit: Payer: Self-pay

## 2013-10-07 ENCOUNTER — Ambulatory Visit (INDEPENDENT_AMBULATORY_CARE_PROVIDER_SITE_OTHER): Payer: BC Managed Care – PPO | Admitting: Internal Medicine

## 2013-10-07 ENCOUNTER — Encounter: Payer: Self-pay | Admitting: Internal Medicine

## 2013-10-07 VITALS — BP 124/68 | HR 60 | Temp 98.5°F | Wt 210.2 lb

## 2013-10-07 DIAGNOSIS — R238 Other skin changes: Secondary | ICD-10-CM

## 2013-10-07 DIAGNOSIS — M79609 Pain in unspecified limb: Secondary | ICD-10-CM

## 2013-10-07 DIAGNOSIS — M79662 Pain in left lower leg: Secondary | ICD-10-CM

## 2013-10-07 MED ORDER — TRAMADOL HCL 50 MG PO TABS: 50.0000 mg | ORAL_TABLET | Freq: Three times a day (TID) | ORAL | Status: DC | PRN

## 2013-10-07 NOTE — Progress Notes (Signed)
Pre visit review using our clinic review tool, if applicable. No additional management support is needed unless otherwise documented below in the visit note. 

## 2013-10-07 NOTE — Patient Instructions (Addendum)
Wear over the calf support hose if you are standing , walking  , or working on your feet for prolonged periods of time.  If symptoms persist;I recommend a Sports Medicine consultation with Dr Ayesha Mohair @ [facility] office to determine optimal therapy

## 2013-10-07 NOTE — Progress Notes (Signed)
   Subjective:    Patient ID: Cody Castro, male    DOB: 11/19/1965, 47 y.o.   MRN: [id number]  HPI  Symptoms began 10/04/13 in the afternoon as sharp pain up to a level 8 along the middle third of the left lower leg lateral to the tibia.  There was no trigger or injury at that time. He does remember striking his shin on a drawer with some bruising  approximately a month ago.  There is no pain  sitting , standing or even climbing a ladder. He starts having pain at 50 feet and is unable to walk 100 yards without stopping  Ice has been of benefit. He also did some compression wraps as well.   Review of Systems He denies fever, chills, sweats, weight loss  He's had no rash, color change, or temperature change in the area of the pain.  He denies chest pain, palpitations, or dyspnea.  He has a long history of easy bruising.  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       Objective:   Physical Exam Gen.: Thin but well-nourished in appearance. Alert, appropriate and cooperative throughout exam.Appears younger than stated age  Eyes: No corneal or conjunctival inflammation noted.   Mouth: Oral mucosa and oropharynx reveal no lesions or exudates. Teeth in good repair. Neck: No deformities, masses, or tenderness noted. Range of motion normal Lungs: Normal respiratory effort; chest expands symmetrically. Lungs are clear to auscultation without rales, wheezes, or increased work of breathing. Heart: Normal rate and rhythm. Accentuated  S1 ; normal  S2. No gallop, click, or rub. No murmur. Abdomen: Bowel sounds normal; abdomen soft and nontender. No masses, organomegaly or hernias noted.                                   Musculoskeletal/extremities: No deformity or scoliosis noted of  the thoracic or lumbar spine.   No clubbing, cyanosis, edema, or significant extremity  deformity noted. Range of motion normal .Tone & strength  normal. Hand joints normal . Fingernail  health good.Isolated toenail changes R foot. The circumference at the ankle/shin 39 cm below the inferior patellar margin is 25 cm bilaterally Able to lie down & sit up w/o help.  Vascular: dorsalis pedis and  posterior tibial pulses are full and equal. No bruits present. Homans sign negative; but with compression of the calf he has pain lateral to the inferior tibia on the left. Neurologic: Alert and oriented x3. Deep tendon reflexes symmetrical and normal.    Skin:Scattered vitiligo & bruising Lymph: No cervical, axillary lymphadenopathy present. Psych: Mood and affect are normal. Normally interactive                                                                                        Assessment & Plan:  #1 left shin/calf pain. Compartmental syndrome suggested  #2 History of easy bruising  Plan: See orders and recommendations.

## 2013-10-08 ENCOUNTER — Other Ambulatory Visit: Payer: Self-pay | Admitting: Internal Medicine

## 2013-10-08 DIAGNOSIS — D649 Anemia, unspecified: Secondary | ICD-10-CM

## 2013-10-08 LAB — CBC WITH DIFFERENTIAL/PLATELET
Basophils Absolute: 0 10*3/uL (ref 0.0–0.1)
Eosinophils Absolute: 0.2 10*3/uL (ref 0.0–0.7)
Lymphocytes Relative: 40.4 % (ref 12.0–46.0)
MCHC: 33 g/dL (ref 30.0–36.0)
Neutrophils Relative %: 47.3 % (ref 43.0–77.0)
RDW: 13.8 % (ref 11.5–14.6)

## 2015-12-02 ENCOUNTER — Encounter: Payer: Self-pay | Admitting: Medical

## 2015-12-02 ENCOUNTER — Ambulatory Visit (INDEPENDENT_AMBULATORY_CARE_PROVIDER_SITE_OTHER): Payer: Managed Care, Other (non HMO) | Admitting: Medical

## 2015-12-02 VITALS — BP 98/60 | HR 74 | Temp 98.0°F | Ht 72.0 in | Wt 220.6 lb

## 2015-12-02 DIAGNOSIS — H9313 Tinnitus, bilateral: Secondary | ICD-10-CM

## 2015-12-02 DIAGNOSIS — H6693 Otitis media, unspecified, bilateral: Secondary | ICD-10-CM

## 2015-12-02 DIAGNOSIS — J0100 Acute maxillary sinusitis, unspecified: Secondary | ICD-10-CM

## 2015-12-02 MED ORDER — FLUTICASONE PROPIONATE 50 MCG/ACT NA SUSP: 2.0000 | Freq: Every day | NASAL | Status: DC

## 2015-12-02 MED ORDER — AMOXICILLIN-POT CLAVULANATE 875-125 MG PO TABS: 1.0000 | ORAL_TABLET | Freq: Two times a day (BID) | ORAL | Status: DC

## 2015-12-02 NOTE — Progress Notes (Signed)
Pre visit review using our clinic review tool, if applicable. No additional management support is needed unless otherwise documented below in the visit note. 

## 2015-12-02 NOTE — Patient Instructions (Addendum)
You appear to have   have a sinus infection and OM(bilateral). I am prescribing  augmentin antibiotic for the infection. To help with the nasal congestion I prescribed flonase nasal steroid.   You may have some eustachian tube pressure and flonase may help with this.  Will see how tinnitus responds to above measures. If this persist then consider referral to ENT. They could evaluate your deviated septum as well.  Rest, hydrate, tylenol for fever.  Follow up in 14 days or as needed

## 2015-12-02 NOTE — Progress Notes (Signed)
Subjective:    Patient ID: Cody Castro, male    DOB: 1966/09/15, 50 y.o.   MRN: [id number]  HPI  Pt in with some occasional ear pain and tinnitus. Pt had this for about 3 months.   Pt states over last 3-4 months will get a day of pain  Moderate ear pain. This ear pain has been present over all for about 3-4 days. Last day of pain occurred about one month ago.  Pt has some recent nasal congestion over last 2-3 months some sinus pain as well. Both nasal congestion and sinus pressure both present today.  Pt ringing is occuring on both sides. No history of loud sound traumas or injury.     Review of Systems  Constitutional: Negative for fever, chills and fatigue.  HENT: Positive for congestion, ear pain, sinus pressure and tinnitus. Negative for drooling, postnasal drip, sore throat, trouble swallowing and voice change.        Tinnitus.  Intermittent ear pain.  Respiratory: Negative for cough, choking and wheezing.   Cardiovascular: Negative for chest pain and palpitations.  Musculoskeletal: Negative for back pain.  Hematological: Negative for adenopathy. Does not bruise/bleed easily.  Psychiatric/Behavioral: Negative for behavioral problems and confusion.   Past Medical History  Diagnosis Date  . Vitiligo     Social History   Social History  . Marital Status: Married    Spouse Name: N/A  . Number of Children: N/A  . Years of Education: N/A   Occupational History  . [employer]  w/s    Social History Main Topics  . Smoking status: Never Smoker   . Smokeless tobacco: Never Used  . Alcohol Use: 7.0 oz/week    14 drink(s) per week  . Drug Use: No  . Sexual Activity:    Partners: Female   Other Topics Concern  . Not on file   Social History Narrative   Exercise--  Walking/ jogging    No past surgical history on file.  Family History  Problem Relation Age of Onset  . Hypertension Mother   . Depression Mother   . Depression Sister   . Hypertension Sister     . Cancer Maternal Aunt 30    breast  . Heart disease Paternal Grandfather   . Parkinsonism Paternal Grandfather   . Alzheimer's disease Paternal Grandfather   . Deep vein thrombosis Paternal Grandfather     Allergies  Allergen Reactions  . 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 traMADol (ULTRAM) 50 MG tablet Take 1 tablet (50 mg total) by mouth every 8 (eight) hours as needed. (Patient not taking: Reported on 12/02/2015) 30 tablet 0   No current facility-administered medications on file prior to visit.    BP 98/60 mmHg  Pulse 74  Temp(Src) 98 F (36.7 C) (Oral)  Ht 6' (1.829 m)  Wt 220 lb 9.6 oz (100.064 kg)  BMI 29.91 kg/m2  SpO2 96%       Objective:   Physical Exam  General  Mental Status - Alert. General Appearance - Well groomed. Not in acute distress.  Skin Rashes- No Rashes.  HEENT Head- Normal. Ear Auditory Canal - Left- Normal. Right - Normal.Tympanic Membrane- Left- mild red. Right- moderate red. Eye Sclera/Conjunctiva- Left- Normal. Right- Normal. Nose & Sinuses Nasal Mucosa- Left-  Boggy and Congested. Right-  Boggy and  Congested.Bilateral maxillary but  no  frontal sinus pressure. Deviated septum Mouth & Throat Lips: Upper Lip- Normal: no dryness, cracking, pallor, cyanosis, or vesicular eruption. Lower Lip-Normal: no dryness, cracking, pallor, cyanosis or vesicular eruption. Buccal Mucosa- Bilateral- No Aphthous ulcers. Oropharynx- No Discharge or Erythema. Tonsils: Characteristics- Bilateral- No Erythema or Congestion. Size/Enlargement- Bilateral- No enlargement. Discharge- bilateral-None.  Neck Neck- Supple. No Masses.   Chest and Lung Exam Auscultation: Breath Sounds:-Clear even and unlabored.  Cardiovascular Auscultation:Rythm- Regular, rate and rhythm. Murmurs & Other Heart Sounds:Ausculatation of the heart reveal- No  Murmurs.  Lymphatic Head & Neck General Head & Neck Lymphatics: Bilateral: Description- No Localized lymphadenopathy     Assessment & Plan:  You  appear to have a sinus infection and OM. I am prescribing  augmentin antibiotic for the infection. To help with the nasal congestion I prescribed flonase nasal steroid.   You may have some eustachian tube pressure and flonase may help with this.  Will see how tinnitus responds to above measures. If this persist then consider referral to ENT. They could evaluate your deviated septum as well.  Rest, hydrate, tylenol for fever. Follow up in 14 days or as needed

## 2015-12-04 ENCOUNTER — Ambulatory Visit: Payer: Self-pay | Admitting: Physician Assistant

## 2015-12-17 ENCOUNTER — Telehealth: Payer: Self-pay | Admitting: *Deleted

## 2015-12-17 NOTE — Telephone Encounter (Signed)
Unable to reach patient at time of pre-visit call. Left message for patient to return call when available.  

## 2015-12-18 ENCOUNTER — Ambulatory Visit: Payer: Managed Care, Other (non HMO) | Admitting: Medical

## 2015-12-18 ENCOUNTER — Ambulatory Visit (INDEPENDENT_AMBULATORY_CARE_PROVIDER_SITE_OTHER): Payer: Managed Care, Other (non HMO) | Admitting: Medical

## 2015-12-18 ENCOUNTER — Encounter: Payer: Self-pay | Admitting: Medical

## 2015-12-18 ENCOUNTER — Telehealth: Payer: Self-pay | Admitting: Medical

## 2015-12-18 VITALS — BP 100/60 | HR 59 | Temp 98.1°F | Ht 72.0 in | Wt 221.0 lb

## 2015-12-18 DIAGNOSIS — J0100 Acute maxillary sinusitis, unspecified: Secondary | ICD-10-CM | POA: Diagnosis not present

## 2015-12-18 DIAGNOSIS — H9313 Tinnitus, bilateral: Secondary | ICD-10-CM | POA: Diagnosis not present

## 2015-12-18 DIAGNOSIS — J342 Deviated nasal septum: Secondary | ICD-10-CM

## 2015-12-18 DIAGNOSIS — H6693 Otitis media, unspecified, bilateral: Secondary | ICD-10-CM | POA: Diagnosis not present

## 2015-12-18 MED ORDER — PREDNISONE 10 MG PO TABS: ORAL_TABLET | ORAL | Status: DC

## 2015-12-18 MED ORDER — AZELASTINE HCL 0.1 % NA SOLN: 2.0000 | Freq: Two times a day (BID) | NASAL | Status: DC

## 2015-12-18 MED ORDER — LEVOFLOXACIN 500 MG PO TABS: 500.0000 mg | ORAL_TABLET | Freq: Every day | ORAL | Status: DC

## 2015-12-18 NOTE — Patient Instructions (Signed)
Will rx levofloxin antibiotoic.  Continue flonase and add astelin.  Will rx 5 day taper prednisone.  Will go ahead and refer for deviated septum, tinnitus, and chronic sinus issues.  Follow up 10-14 days if ENT referral delayed.

## 2015-12-18 NOTE — Telephone Encounter (Signed)
Can pt be seen within 2 wks for ent referal.

## 2015-12-18 NOTE — Progress Notes (Signed)
Pre visit review using our clinic review tool, if applicable. No additional management support is needed unless otherwise documented below in the visit note. 

## 2015-12-18 NOTE — Progress Notes (Signed)
Subjective:    Patient ID: Cody Castro, male    DOB: [birth year], 50 y.o.   MRN: [id number]  HPI  Pt still has sinus pressure, nasal congestion, and tinnitus. Pt did not get better with augmentin and flonase.  Ringing of ears  has been constant for 3 months.. But sinus pressure and congestion has been intermittent x 3 months   Pt ears have hurt little bit recently. Mild intermittent pain.  Above symptoms persisting despite augmentin and flonase recently on last visit.     Review of Systems  Constitutional: Negative for fever, chills and fatigue.  HENT: Positive for congestion, sinus pressure and tinnitus.   Respiratory: Negative for cough, choking and wheezing.   Cardiovascular: Negative for chest pain and palpitations.  Neurological: Negative for dizziness and headaches.  Hematological: Negative for adenopathy. Does not bruise/bleed easily.    Past Medical History  Diagnosis Date  . Vitiligo     Social History   Social History  . Marital Status: Married    Spouse Name: N/A  . Number of Children: N/A  . Years of Education: N/A   Occupational History  . [employer]  w/s    Social History Main Topics  . Smoking status: Never Smoker   . Smokeless tobacco: Never Used  . Alcohol Use: 7.0 oz/week    14 drink(s) per week  . Drug Use: No  . Sexual Activity:    Partners: Female   Other Topics Concern  . Not on file   Social History Narrative   Exercise--  Walking/ jogging    History reviewed. No pertinent past surgical history.  Family History  Problem Relation Age of Onset  . Hypertension Mother   . Depression Mother   . Depression Sister   . Hypertension Sister   . Cancer Maternal Aunt 30    breast  . Heart disease Paternal Grandfather   . Parkinsonism Paternal Grandfather   . Alzheimer's disease Paternal Grandfather   . Deep vein thrombosis Paternal Grandfather     Allergies  Allergen Reactions  . Aspirin Other (See Comments)    Duodenal Ulcer @  age 13    No current outpatient prescriptions on file prior to visit.   No current facility-administered medications on file prior to visit.    BP 100/60 mmHg  Pulse 59  Temp(Src) 98.1 F (36.7 C) (Oral)  Ht 6' (1.829 m)  Wt 221 lb (100.245 kg)  BMI 29.97 kg/m2  SpO2 98%       Objective:   Physical Exam  General  Mental Status - Alert. General Appearance - Well groomed. Not in acute distress.  Skin Rashes- No Rashes.  HEENT Head- Normal. Ear Auditory Canal - Left- Normal. Right - Normal.Tympanic Membrane- Left- mild red. Right- moderate red. Eye Sclera/Conjunctiva- Left- Normal. Right- Normal. Nose & Sinuses Nasal Mucosa- Left- Boggy and Congested. Right- Boggy and Congested.Bilateral maxillary but no frontal sinus pressure. Deviated septum Mouth & Throat Lips: Upper Lip- Normal: no dryness, cracking, pallor, cyanosis, or vesicular eruption. Lower Lip-Normal: no dryness, cracking, pallor, cyanosis or vesicular eruption. Buccal Mucosa- Bilateral- No Aphthous ulcers. Oropharynx- No Discharge or Erythema. Tonsils: Characteristics- Bilateral- No Erythema or Congestion. Size/Enlargement- Bilateral- No enlargement. Discharge- bilateral-None.  Neck Neck- Supple. No Masses.   Chest and Lung Exam Auscultation: Breath Sounds:-Clear even and unlabored.  Cardiovascular Auscultation:Rythm- Regular, rate and rhythm. Murmurs & Other Heart Sounds:Ausculatation of the heart reveal- No Murmurs.  Lymphatic Head & Neck General Head & Neck Lymphatics: Bilateral:  Description- No Localized lymphadenopathy      Assessment & Plan:  Will rx levofloxin antibiotoic.  Continue flonase and add astelin.  Will rx 5 day taper prednisone.  Will go ahead and refer for deviated septum, tinnitus, and chronic sinus issues.  Follow up 10-14 days if ENT referral delayed.

## 2016-01-13 ENCOUNTER — Encounter: Payer: Self-pay | Admitting: Medical

## 2016-01-13 ENCOUNTER — Ambulatory Visit (INDEPENDENT_AMBULATORY_CARE_PROVIDER_SITE_OTHER): Payer: Managed Care, Other (non HMO) | Admitting: Medical

## 2016-01-13 VITALS — BP 98/68 | HR 64 | Temp 97.7°F | Ht 72.0 in | Wt 224.8 lb

## 2016-01-13 DIAGNOSIS — Z113 Encounter for screening for infections with a predominantly sexual mode of transmission: Secondary | ICD-10-CM | POA: Diagnosis not present

## 2016-01-13 DIAGNOSIS — Z23 Encounter for immunization: Secondary | ICD-10-CM

## 2016-01-13 DIAGNOSIS — Z0000 Encounter for general adult medical examination without abnormal findings: Secondary | ICD-10-CM | POA: Diagnosis not present

## 2016-01-13 LAB — CBC WITH DIFFERENTIAL/PLATELET
BASOS ABS: 0 10*3/uL (ref 0.0–0.1)
BASOS PCT: 0.3 % (ref 0.0–3.0)
Eosinophils Absolute: 0.2 10*3/uL (ref 0.0–0.7)
Eosinophils Relative: 4.6 % (ref 0.0–5.0)
HEMATOCRIT: 46.4 % (ref 39.0–52.0)
Hemoglobin: 15.5 g/dL (ref 13.0–17.0)
LYMPHS PCT: 38.4 % (ref 12.0–46.0)
Lymphs Abs: 2.1 10*3/uL (ref 0.7–4.0)
MCHC: 33.4 g/dL (ref 30.0–36.0)
MCV: 97.8 fl (ref 78.0–100.0)
Monocytes Absolute: 0.4 10*3/uL (ref 0.1–1.0)
Monocytes Relative: 8 % (ref 3.0–12.0)
NEUTROS ABS: 2.7 10*3/uL (ref 1.4–7.7)
Neutrophils Relative %: 48.7 % (ref 43.0–77.0)
PLATELETS: 255 10*3/uL (ref 150.0–400.0)
RBC: 4.74 Mil/uL (ref 4.22–5.81)
RDW: 13.6 % (ref 11.5–15.5)
WBC: 5.4 10*3/uL (ref 4.0–10.5)

## 2016-01-13 LAB — POC URINALSYSI DIPSTICK (AUTOMATED)
Bilirubin, UA: NEGATIVE
Blood, UA: NEGATIVE
Glucose, UA: NEGATIVE
Ketones, UA: NEGATIVE
LEUKOCYTES UA: NEGATIVE
NITRITE UA: NEGATIVE
PH UA: 6
PROTEIN UA: NEGATIVE
Spec Grav, UA: >=1.03
UROBILINOGEN UA: 0.2

## 2016-01-13 LAB — LIPID PANEL
CHOLESTEROL: 215 mg/dL — AB (ref 0–200)
HDL: 56 mg/dL (ref >39.00)
LDL Cholesterol: 137 mg/dL — ABNORMAL HIGH (ref 0–99)
NonHDL: 158.62
Total CHOL/HDL Ratio: 4
Triglycerides: 109 mg/dL (ref 0.0–149.0)
VLDL: 21.8 mg/dL (ref 0.0–40.0)

## 2016-01-13 LAB — COMPREHENSIVE METABOLIC PANEL
ALBUMIN: 4.4 g/dL (ref 3.5–5.2)
ALT: 24 U/L (ref 0–53)
AST: 23 U/L (ref 0–37)
Alkaline Phosphatase: 46 U/L (ref 39–117)
BILIRUBIN TOTAL: 0.7 mg/dL (ref 0.2–1.2)
BUN: 17 mg/dL (ref 6–23)
CALCIUM: 9.6 mg/dL (ref 8.4–10.5)
CO2: 32 meq/L (ref 19–32)
Chloride: 103 mEq/L (ref 96–112)
Creatinine, Ser: 0.94 mg/dL (ref 0.40–1.50)
GFR: 90.82 mL/min (ref >60.00)
Glucose, Bld: 90 mg/dL (ref 70–99)
Potassium: 4.5 mEq/L (ref 3.5–5.1)
Sodium: 140 mEq/L (ref 135–145)
Total Protein: 7 g/dL (ref 6.0–8.3)

## 2016-01-13 LAB — TSH: TSH: 1.16 u[IU]/mL (ref 0.35–4.50)

## 2016-01-13 LAB — HIV ANTIBODY (ROUTINE TESTING W REFLEX): HIV: NONREACTIVE

## 2016-01-13 NOTE — Assessment & Plan Note (Signed)
Cbc, cmp, tsh, lipid panel, HIV screen  and UA.

## 2016-01-13 NOTE — Progress Notes (Signed)
Subjective:    Patient ID: Cody Castro, male    DOB: 04/18/1966, 50 y.o.   MRN: [id number]  HPI  I have reviewed pt PMH, PSH, FH, Social History and Surgical History  Pt is exercising 2-3 times a week until he got some left calf pain jogging. Now this getting better. 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  Pt needs tetanus update.  Will get hiv screen.  Pt declines flu vaccine.  Pt is fasting today.  No family history of prostate CA.   Review of Systems  Constitutional: Negative for fever, chills, activity change and fatigue.  HENT: Negative for dental problem, ear pain and sinus pressure.   Respiratory: Negative for cough, chest tightness and shortness of breath.   Cardiovascular: Negative for chest pain and palpitations.  Gastrointestinal: Negative for nausea, vomiting and abdominal pain.  Musculoskeletal: Negative for neck pain and neck stiffness.       Lt calf pain.  Skin: Negative for rash.  Neurological: Negative for dizziness, tremors, seizures, facial asymmetry, weakness, light-headedness and headaches.  Hematological: Negative for adenopathy. Does not bruise/bleed easily.  Psychiatric/Behavioral: Negative for confusion and agitation. The patient is not nervous/anxious.      Past Medical History  Diagnosis Date  . Vitiligo     Social History   Social History  . Marital Status: Married    Spouse Name: N/A  . Number of Children: N/A  . Years of Education: N/A   Occupational History  . [employer]  w/s    Social History Main Topics  . Smoking status: Never Smoker   . Smokeless tobacco: Never Used  . Alcohol Use: 7.0 oz/week    14 drink(s) per week  . Drug Use: No  . Sexual Activity:    Partners: Female   Other Topics Concern  . Not on file   Social History Narrative   Exercise--  Walking/ jogging    No past surgical history on file.  Family History  Problem Relation Age of  Onset  . Hypertension Mother   . Depression Mother   . Depression Sister   . Hypertension Sister   . Cancer Maternal Aunt 30    breast  . Heart disease Paternal Grandfather   . Parkinsonism Paternal Grandfather   . Alzheimer's disease Paternal Grandfather   . Deep vein thrombosis Paternal Grandfather     Allergies  Allergen Reactions  . Aspirin Other (See Comments)    Duodenal Ulcer @ age 56    Current Outpatient Prescriptions on File Prior to Visit  Medication Sig Dispense Refill  . azelastine (ASTELIN) 0.1 % nasal spray Place 2 sprays into both nostrils 2 (two) times daily. Use in each nostril as directed 30 mL 12   No current facility-administered medications on file prior to visit.    BP 98/68 mmHg  Pulse 64  Temp(Src) 97.7 F (36.5 C) (Oral)  Ht 6' (1.829 m)  Wt 224 lb 12.8 oz (101.969 kg)  BMI 30.48 kg/m2  SpO2 96%       Objective:   Physical Exam  General Mental Status- Alert. General Appearance- Not in acute distress.   Skin General: Color- Normal Color. Moisture- Normal Moisture. Some scattered appearing vitiligo.  Neck Carotid Arteries- Normal color. Moisture- Normal Moisture. No carotid bruits. No JVD.  Chest and Lung Exam Auscultation: Breath Sounds:-Normal.  Cardiovascular Auscultation:Rythm- Regular. Murmurs & Other Heart Sounds:Auscultation of the heart  reveals- No Murmurs.  Abdomen Inspection:-Inspeection Normal. Palpation/Percussion:Note:No mass. Palpation and Percussion of the abdomen reveal- Non Tender, Non Distended + BS, no rebound or guarding.    Neurologic Cranial Nerve exam:- CN III-XII intact(No nystagmus), symmetric smile. Strength:- 5/5 equal and symmetric strength both upper and lower extremities.   Male Genitourinary Urethra:- No discharge. Penis- Circumcised. Scrotum- No masses. Testes- Bilateral-Normal.  Lt calf- mid aspect mild tender. Negative homans sign. No achilles tenderness. No swelling of  calf.           Assessment & Plan:

## 2016-01-13 NOTE — Patient Instructions (Addendum)
Wellness examination Cbc, cmp, tsh, lipid panel, HIV screen  and UA.    Tetanus update today  Continue exercising but after left calf strain improves. Maybe just walk for another 2-3 weeks and then start back slowly. But if not getting better can refer to sports med.  Will get lab results and let you know the results.  Follow up date to be determined after lab results.  Preventive Care for Adults, Male A healthy lifestyle and preventive care can promote health and wellness. Preventive health guidelines for men include the following key practices:  A routine yearly physical is a good way to check with your health care provider about your health and preventative screening. It is a chance to share any concerns and updates on your health and to receive a thorough exam.  Visit your dentist for a routine exam and preventative care every 6 months. Brush your teeth twice a day and floss once a day. Good oral hygiene prevents tooth decay and gum disease.  The frequency of eye exams is based on your age, health, family medical history, use of contact lenses, and other factors. Follow your health care provider's recommendations for frequency of eye exams.  Eat a healthy diet. Foods such as vegetables, fruits, whole grains, low-fat dairy products, and lean protein foods contain the nutrients you need without too many calories. Decrease your intake of foods high in solid fats, added sugars, and salt. Eat the right amount of calories for you.Get information about a proper diet from your health care provider, if necessary.  Regular physical exercise is one of the most important things you can do for your health. Most adults should get at least 150 minutes of moderate-intensity exercise (any activity that increases your heart rate and causes you to sweat) each week. In addition, most adults need muscle-strengthening exercises on 2 or more days a week.  Maintain a healthy weight. The body mass index (BMI)  is a screening tool to identify possible weight problems. It provides an estimate of body fat based on height and weight. Your health care provider can find your BMI and can help you achieve or maintain a healthy weight.For adults 20 years and older:  A BMI below 18.5 is considered underweight.  A BMI of 18.5 to 24.9 is normal.  A BMI of 25 to 29.9 is considered overweight.  A BMI of 30 and above is considered obese.  Maintain normal blood lipids and cholesterol levels by exercising and minimizing your intake of saturated fat. Eat a balanced diet with plenty of fruit and vegetables. Blood tests for lipids and cholesterol should begin at age 50 and be repeated every 5 years. If your lipid or cholesterol levels are high, you are over 50, or you are at high risk for heart disease, you may need your cholesterol levels checked more frequently.Ongoing high lipid and cholesterol levels should be treated with medicines if diet and exercise are not working.  If you smoke, find out from your health care provider how to quit. If you do not use tobacco, do not start.  Lung cancer screening is recommended for adults aged 64-80 years who are at high risk for developing lung cancer because of a history of smoking. A yearly low-dose CT scan of the lungs is recommended for people who have at least a 30-pack-year history of smoking and are a current smoker or have quit within the past 15 years. A pack year of smoking is smoking an average of 1 pack  of cigarettes a day for 1 year (for example: 1 pack a day for 30 years or 2 packs a day for 15 years). Yearly screening should continue until the smoker has stopped smoking for at least 15 years. Yearly screening should be stopped for people who develop a health problem that would prevent them from having lung cancer treatment.  If you choose to drink alcohol, do not have more than 2 drinks per day. One drink is considered to be 12 ounces (355 mL) of beer, 5 ounces (148  mL) of wine, or 1.5 ounces (44 mL) of liquor.  Avoid use of street drugs. Do not share needles with anyone. Ask for help if you need support or instructions about stopping the use of drugs.  High blood pressure causes heart disease and increases the risk of stroke. Your blood pressure should be checked at least every 1-2 years. Ongoing high blood pressure should be treated with medicines, if weight loss and exercise are not effective.  If you are 54-57 years old, ask your health care provider if you should take aspirin to prevent heart disease.  Diabetes screening is done by taking a blood sample to check your blood glucose level after you have not eaten for a certain period of time (fasting). If you are not overweight and you do not have risk factors for diabetes, you should be screened once every 3 years starting at age 74. If you are overweight or obese and you are 70-6 years of age, you should be screened for diabetes every year as part of your cardiovascular risk assessment.  Colorectal cancer can be detected and often prevented. Most routine colorectal cancer screening begins at the age of 20 and continues through age 4. However, your health care provider may recommend screening at an earlier age if you have risk factors for colon cancer. On a yearly basis, your health care provider may provide home test kits to check for hidden blood in the stool. Use of a small camera at the end of a tube to directly examine the colon (sigmoidoscopy or colonoscopy) can detect the earliest forms of colorectal cancer. Talk to your health care provider about this at age 18, when routine screening begins. Direct exam of the colon should be repeated every 5-10 years through age 31, unless early forms of precancerous polyps or small growths are found.  People who are at an increased risk for hepatitis B should be screened for this virus. You are considered at high risk for hepatitis B if:  You were born in a  country where hepatitis B occurs often. Talk with your health care provider about which countries are considered high risk.  Your parents were born in a high-risk country and you have not received a shot to protect against hepatitis B (hepatitis B vaccine).  You have HIV or AIDS.  You use needles to inject street drugs.  You live with, or have sex with, someone who has hepatitis B.  You are a man who has sex with other men (MSM).  You get hemodialysis treatment.  You take certain medicines for conditions such as cancer, organ transplantation, and autoimmune conditions.  Hepatitis C blood testing is recommended for all people born from [birth year] through 1965 and any individual with known risks for hepatitis C.  Practice safe sex. Use condoms and avoid high-risk sexual practices to reduce the spread of sexually transmitted infections (STIs). STIs include gonorrhea, chlamydia, syphilis, trichomonas, herpes, HPV, and human immunodeficiency virus (HIV). Herpes,  HIV, and HPV are viral illnesses that have no cure. They can result in disability, cancer, and death.  If you are a man who has sex with other men, you should be screened at least once per year for:  HIV.  Urethral, rectal, and pharyngeal infection of gonorrhea, chlamydia, or both.  If you are at risk of being infected with HIV, it is recommended that you take a prescription medicine daily to prevent HIV infection. This is called preexposure prophylaxis (PrEP). You are considered at risk if:  You are a man who has sex with other men (MSM) and have other risk factors.  You are a heterosexual man, are sexually active, and are at increased risk for HIV infection.  You take drugs by injection.  You are sexually active with a partner who has HIV.  Talk with your health care provider about whether you are at high risk of being infected with HIV. If you choose to begin PrEP, you should first be tested for HIV. You should then be tested  every 3 months for as long as you are taking PrEP.  A one-time screening for abdominal aortic aneurysm (AAA) and surgical repair of large AAAs by ultrasound are recommended for men ages 1 to 33 years who are current or former smokers.  Healthy men should no longer receive prostate-specific antigen (PSA) blood tests as part of routine cancer screening. Talk with your health care provider about prostate cancer screening.  Testicular cancer screening is not recommended for adult males who have no symptoms. Screening includes self-exam, a health care provider exam, and other screening tests. Consult with your health care provider about any symptoms you have or any concerns you have about testicular cancer.  Use sunscreen. Apply sunscreen liberally and repeatedly throughout the day. You should seek shade when your shadow is shorter than you. Protect yourself by wearing long sleeves, pants, a wide-brimmed hat, and sunglasses year round, whenever you are outdoors.  Once a month, do a whole-body skin exam, using a mirror to look at the skin on your back. Tell your health care provider about new moles, moles that have irregular borders, moles that are larger than a pencil eraser, or moles that have changed in shape or color.  Stay current with required vaccines (immunizations).  Influenza vaccine. All adults should be immunized every year.  Tetanus, diphtheria, and acellular pertussis (Td, Tdap) vaccine. An adult who has not previously received Tdap or who does not know his vaccine status should receive 1 dose of Tdap. This initial dose should be followed by tetanus and diphtheria toxoids (Td) booster doses every 10 years. Adults with an unknown or incomplete history of completing a 3-dose immunization series with Td-containing vaccines should begin or complete a primary immunization series including a Tdap dose. Adults should receive a Td booster every 10 years.  Varicella vaccine. An adult without  evidence of immunity to varicella should receive 2 doses or a second dose if he has previously received 1 dose.  Human papillomavirus (HPV) vaccine. Males aged 11-21 years who have not received the vaccine previously should receive the 3-dose series. Males aged 22-26 years may be immunized. Immunization is recommended through the age of 49 years for any male who has sex with males and did not get any or all doses earlier. Immunization is recommended for any person with an immunocompromised condition through the age of 54 years if he did not get any or all doses earlier. During the 3-dose series, the second  dose should be obtained 4-8 weeks after the first dose. The third dose should be obtained 24 weeks after the first dose and 16 weeks after the second dose.  Zoster vaccine. One dose is recommended for adults aged 20 years or older unless certain conditions are present.  Measles, mumps, and rubella (MMR) vaccine. Adults born before 40 generally are considered immune to measles and mumps. Adults born in [birth year] or later should have 1 or more doses of MMR vaccine unless there is a contraindication to the vaccine or there is laboratory evidence of immunity to each of the three diseases. A routine second dose of MMR vaccine should be obtained at least 28 days after the first dose for students attending postsecondary schools, health care workers, or international travelers. People who received inactivated measles vaccine or an unknown type of measles vaccine during 1963-1967 should receive 2 doses of MMR vaccine. People who received inactivated mumps vaccine or an unknown type of mumps vaccine before 1979 and are at high risk for mumps infection should consider immunization with 2 doses of MMR vaccine. Unvaccinated health care workers born before [birth year] who lack laboratory evidence of measles, mumps, or rubella immunity or laboratory confirmation of disease should consider measles and mumps immunization with 2 doses  of MMR vaccine or rubella immunization with 1 dose of MMR vaccine.  Pneumococcal 13-valent conjugate (PCV13) vaccine. When indicated, a person who is uncertain of his immunization history and has no record of immunization should receive the PCV13 vaccine. All adults 39 years of age and older should receive this vaccine. An adult aged 35 years or older who has certain medical conditions and has not been previously immunized should receive 1 dose of PCV13 vaccine. This PCV13 should be followed with a dose of pneumococcal polysaccharide (PPSV23) vaccine. Adults who are at high risk for pneumococcal disease should obtain the PPSV23 vaccine at least 8 weeks after the dose of PCV13 vaccine. Adults older than 49 years of age who have normal immune system function should obtain the PPSV23 vaccine dose at least 1 year after the dose of PCV13 vaccine.  Pneumococcal polysaccharide (PPSV23) vaccine. When PCV13 is also indicated, PCV13 should be obtained first. All adults aged 66 years and older should be immunized. An adult younger than age 26 years who has certain medical conditions should be immunized. Any person who resides in a nursing home or long-term care facility should be immunized. An adult smoker should be immunized. People with an immunocompromised condition and certain other conditions should receive both PCV13 and PPSV23 vaccines. People with human immunodeficiency virus (HIV) infection should be immunized as soon as possible after diagnosis. Immunization during chemotherapy or radiation therapy should be avoided. Routine use of PPSV23 vaccine is not recommended for American Indians, [place] Natives, or people younger than 65 years unless there are medical conditions that require PPSV23 vaccine. When indicated, people who have unknown immunization and have no record of immunization should receive PPSV23 vaccine. One-time revaccination 5 years after the first dose of PPSV23 is recommended for people aged 19-64  years who have chronic kidney failure, nephrotic syndrome, asplenia, or immunocompromised conditions. People who received 1-2 doses of PPSV23 before age 84 years should receive another dose of PPSV23 vaccine at age 63 years or later if at least 5 years have passed since the previous dose. Doses of PPSV23 are not needed for people immunized with PPSV23 at or after age 50 years.  Meningococcal vaccine. Adults with asplenia or persistent complement component deficiencies  should receive 2 doses of quadrivalent meningococcal conjugate (MenACWY-D) vaccine. The doses should be obtained at least 2 months apart. Microbiologists working with certain meningococcal bacteria, [employer] recruits, people at risk during an outbreak, and people who travel to or live in countries with a high rate of meningitis should be immunized. A first-year college student up through age 78 years who is living in a residence hall should receive a dose if he did not receive a dose on or after his 16th birthday. Adults who have certain high-risk conditions should receive one or more doses of vaccine.  Hepatitis A vaccine. Adults who wish to be protected from this disease, have chronic liver disease, work with hepatitis A-infected animals, work in hepatitis A research labs, or travel to or work in countries with a high rate of hepatitis A should be immunized. Adults who were previously unvaccinated and who anticipate close contact with an international adoptee during the first 60 days after arrival in the [place] from a country with a high rate of hepatitis A should be immunized.  Hepatitis B vaccine. Adults should be immunized if they wish to be protected from this disease, are under age 29 years and have diabetes, have chronic liver disease, have had more than one sex partner in the past 6 months, may be exposed to blood or other infectious body fluids, are household contacts or sex partners of hepatitis B positive people, are clients or  workers in certain care facilities, or travel to or work in countries with a high rate of hepatitis B.  Haemophilus influenzae type b (Hib) vaccine. A previously unvaccinated person with asplenia or sickle cell disease or having a scheduled splenectomy should receive 1 dose of Hib vaccine. Regardless of previous immunization, a recipient of a hematopoietic stem cell transplant should receive a 3-dose series 6-12 months after his successful transplant. Hib vaccine is not recommended for adults with HIV infection. Preventive Service / Frequency Ages 41 to 42  Blood pressure check.** / Every 3-5 years.  Lipid and cholesterol check.** / Every 5 years beginning at age 73.  Hepatitis C blood test.** / For any individual with known risks for hepatitis C.  Skin self-exam. / Monthly.  Influenza vaccine. / Every year.  Tetanus, diphtheria, and acellular pertussis (Tdap, Td) vaccine.** / Consult your health care provider. 1 dose of Td every 10 years.  Varicella vaccine.** / Consult your health care provider.  HPV vaccine. / 3 doses over 6 months, if 90 or younger.  Measles, mumps, rubella (MMR) vaccine.** / You need at least 1 dose of MMR if you were born in 1957 or later. You may also need a second dose.  Pneumococcal 13-valent conjugate (PCV13) vaccine.** / Consult your health care provider.  Pneumococcal polysaccharide (PPSV23) vaccine.** / 1 to 2 doses if you smoke cigarettes or if you have certain conditions.  Meningococcal vaccine.** / 1 dose if you are age 77 to 27 years and a Market researcher living in a residence hall, or have one of several medical conditions. You may also need additional booster doses.  Hepatitis A vaccine.** / Consult your health care provider.  Hepatitis B vaccine.** / Consult your health care provider.  Haemophilus influenzae type b (Hib) vaccine.** / Consult your health care provider. Ages 63 to 50  Blood pressure check.** / Every year.  Lipid and  cholesterol check.** / Every 5 years beginning at age 50.  Lung cancer screening. / Every year if you are aged 51-80 years and  have a 30-pack-year history of smoking and currently smoke or have quit within the past 15 years. Yearly screening is stopped once you have quit smoking for at least 15 years or develop a health problem that would prevent you from having lung cancer treatment.  Fecal occult blood test (FOBT) of stool. / Every year beginning at age 20 and continuing until age 27. You may not have to do this test if you get a colonoscopy every 10 years.  Flexible sigmoidoscopy** or colonoscopy.** / Every 5 years for a flexible sigmoidoscopy or every 10 years for a colonoscopy beginning at age 73 and continuing until age 82.  Hepatitis C blood test.** / For all people born from [birth year] through 1965 and any individual with known risks for hepatitis C.  Skin self-exam. / Monthly.  Influenza vaccine. / Every year.  Tetanus, diphtheria, and acellular pertussis (Tdap/Td) vaccine.** / Consult your health care provider. 1 dose of Td every 10 years.  Varicella vaccine.** / Consult your health care provider.  Zoster vaccine.** / 1 dose for adults aged 17 years or older.  Measles, mumps, rubella (MMR) vaccine.** / You need at least 1 dose of MMR if you were born in 1957 or later. You may also need a second dose.  Pneumococcal 13-valent conjugate (PCV13) vaccine.** / Consult your health care provider.  Pneumococcal polysaccharide (PPSV23) vaccine.** / 1 to 2 doses if you smoke cigarettes or if you have certain conditions.  Meningococcal vaccine.** / Consult your health care provider.  Hepatitis A vaccine.** / Consult your health care provider.  Hepatitis B vaccine.** / Consult your health care provider.  Haemophilus influenzae type b (Hib) vaccine.** / Consult your health care provider. Ages 27 and over  Blood pressure check.** / Every year.  Lipid and cholesterol check.**/ Every 5 years  beginning at age 14.  Lung cancer screening. / Every year if you are aged 57-80 years and have a 30-pack-year history of smoking and currently smoke or have quit within the past 15 years. Yearly screening is stopped once you have quit smoking for at least 15 years or develop a health problem that would prevent you from having lung cancer treatment.  Fecal occult blood test (FOBT) of stool. / Every year beginning at age 36 and continuing until age 33. You may not have to do this test if you get a colonoscopy every 10 years.  Flexible sigmoidoscopy** or colonoscopy.** / Every 5 years for a flexible sigmoidoscopy or every 10 years for a colonoscopy beginning at age 30 and continuing until age 38.  Hepatitis C blood test.** / For all people born from [birth year] through 1964 and any individual with known risks for hepatitis C.  Abdominal aortic aneurysm (AAA) screening.** / A one-time screening for ages 30 to 47 years who are current or former smokers.  Skin self-exam. / Monthly.  Influenza vaccine. / Every year.  Tetanus, diphtheria, and acellular pertussis (Tdap/Td) vaccine.** / 1 dose of Td every 10 years.  Varicella vaccine.** / Consult your health care provider.  Zoster vaccine.** / 1 dose for adults aged 35 years or older.  Pneumococcal 13-valent conjugate (PCV13) vaccine.** / 1 dose for all adults aged 1 years and older.  Pneumococcal polysaccharide (PPSV23) vaccine.** / 1 dose for all adults aged 63 years and older.  Meningococcal vaccine.** / Consult your health care provider.  Hepatitis A vaccine.** / Consult your health care provider.  Hepatitis B vaccine.** / Consult your health care provider.  Haemophilus influenzae type b (  Hib) vaccine.** / Consult your health care provider. **Family history and personal history of risk and conditions may change your health care provider's recommendations.   This information is not intended to replace advice given to you by your health care  provider. Make sure you discuss any questions you have with your health care provider.   Document Released: 12/06/2001 Document Revised: 10/31/2014 Document Reviewed: 03/07/2011 Elsevier Interactive Patient Education [employer].

## 2016-01-13 NOTE — Progress Notes (Signed)
Pre visit review using our clinic review tool, if applicable. No additional management support is needed unless otherwise documented below in the visit note. 

## 2021-06-18 ENCOUNTER — Other Ambulatory Visit: Payer: Self-pay

## 2021-06-18 ENCOUNTER — Ambulatory Visit (INDEPENDENT_AMBULATORY_CARE_PROVIDER_SITE_OTHER): Payer: No Typology Code available for payment source | Admitting: Medical

## 2021-06-18 VITALS — BP 122/78 | HR 64 | Resp 18 | Ht 72.0 in | Wt 228.4 lb

## 2021-06-18 DIAGNOSIS — Z125 Encounter for screening for malignant neoplasm of prostate: Secondary | ICD-10-CM

## 2021-06-18 DIAGNOSIS — K219 Gastro-esophageal reflux disease without esophagitis: Secondary | ICD-10-CM

## 2021-06-18 DIAGNOSIS — Z0000 Encounter for general adult medical examination without abnormal findings: Secondary | ICD-10-CM | POA: Diagnosis not present

## 2021-06-18 DIAGNOSIS — N528 Other male erectile dysfunction: Secondary | ICD-10-CM

## 2021-06-18 MED ORDER — SILDENAFIL CITRATE 100 MG PO TABS: 50.0000 mg | ORAL_TABLET | Freq: Every day | ORAL | 3 refills | Status: DC | PRN

## 2021-06-18 MED ORDER — FAMOTIDINE 20 MG PO TABS: 20.0000 mg | ORAL_TABLET | Freq: Two times a day (BID) | ORAL | 0 refills | Status: DC

## 2021-06-18 NOTE — Patient Instructions (Addendum)
For you wellness exam today I have ordered cbc, cmp,  lipid panel and psa(future labs fasting)  Vaccine up to date but recommend ask insurance if they cover shingles vaccine.  Recommend exercise and healthy diet.  We will let you know lab results as they come in.  Follow up date appointment will be determined after lab review.    For ED rx viagra 100 mg dose.   For reflux/gerd recommend healthy diet and can use famotadine. If symptoms persist let us know. Could refer to gi if symptoms persist.  Preventive Care 41-35 Years Old, Male Preventive care refers to lifestyle choices and visits with your health care provider that can promote health and wellness. This includes: A yearly physical exam. This is also called an annual wellness visit. Regular dental and eye exams. Immunizations. Screening for certain conditions. Healthy lifestyle choices, such as: Eating a healthy diet. Getting regular exercise. Not using drugs or products that contain nicotine and tobacco. Limiting alcohol use. What can I expect for my preventive care visit? Physical exam Your health care provider will check your: Height and weight. These may be used to calculate your BMI (body mass index). BMI is a measurement that tells if you are at a healthy weight. Heart rate and blood pressure. Body temperature. Skin for abnormal spots. Counseling Your health care provider may ask you questions about your: Past medical problems. Family's medical history. Alcohol, tobacco, and drug use. Emotional well-being. Home life and relationship well-being. Sexual activity. Diet, exercise, and sleep habits. Work and work Astronomer. Access to firearms. What immunizations do I need?  Vaccines are usually given at various ages, according to a schedule. Your health care provider will recommend vaccines for you based on your age, medicalhistory, and lifestyle or other factors, such as travel or where you work. What tests do I  need? Blood tests Lipid and cholesterol levels. These may be checked every 5 years, or more often if you are over 8 years old. Hepatitis C test. Hepatitis B test. Screening Lung cancer screening. You may have this screening every year starting at age 8 if you have a 30-pack-year history of smoking and currently smoke or have quit within the past 15 years. Prostate cancer screening. Recommendations will vary depending on your family history and other risks. Genital exam to check for testicular cancer or hernias. Colorectal cancer screening. All adults should have this screening starting at age 76 and continuing until age 34. Your health care provider may recommend screening at age 20 if you are at increased risk. You will have tests every 1-10 years, depending on your results and the type of screening test. Diabetes screening. This is done by checking your blood sugar (glucose) after you have not eaten for a while (fasting). You may have this done every 1-3 years. STD (sexually transmitted disease) testing, if you are at risk. Follow these instructions at home: Eating and drinking  Eat a diet that includes fresh fruits and vegetables, whole grains, lean protein, and low-fat dairy products. Take vitamin and mineral supplements as recommended by your health care provider. Do not drink alcohol if your health care provider tells you not to drink. If you drink alcohol: Limit how much you have to 0-2 drinks a day. Be aware of how much alcohol is in your drink. In the [place] one drink equals one 12 oz bottle of beer (355 mL), one 5 oz glass of wine (148 mL), or one 1 oz glass of hard liquor (44 mL).  Lifestyle Take daily care of your teeth and gums. Brush your teeth every morning and night with fluoride toothpaste. Floss one time each day. Stay active. Exercise for at least 30 minutes 5 or more days each week. Do not use any products that contain nicotine or tobacco, such as cigarettes,  e-cigarettes, and chewing tobacco. If you need help quitting, ask your health care provider. Do not use drugs. If you are sexually active, practice safe sex. Use a condom or other form of protection to prevent STIs (sexually transmitted infections). If told by your health care provider, take low-dose aspirin daily starting at age 32. Find healthy ways to cope with stress, such as: Meditation, yoga, or listening to music. Journaling. Talking to a trusted person. Spending time with friends and family. Safety Always wear your seat belt while driving or riding in a vehicle. Do not drive: If you have been drinking alcohol. Do not ride with someone who has been drinking. When you are tired or distracted. While texting. Wear a helmet and other protective equipment during sports activities. If you have firearms in your house, make sure you follow all gun safety procedures. What's next? Go to your health care provider once a year for an annual wellness visit. Ask your health care provider how often you should have your eyes and teeth checked. Stay up to date on all vaccines. This information is not intended to replace advice given to you by your health care provider. Make sure you discuss any questions you have with your healthcare provider. Document Revised: 07/09/2019 Document Reviewed: 10/04/2018 Elsevier Patient Education  2022 [employer].

## 2021-06-18 NOTE — Progress Notes (Signed)
Subjective:    Patient ID: Cody Castro, male    DOB: 07-11-1966, 55 y.o.   MRN: [id number]  HPI  Pt in for re-establish care/cpe.  Hx of low vit d, some ED and some recent heartburn on occasional. Gerd type symptoms for one year. Symptoms for 3 days a week. Will respond to tums.  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 2 cups of coffee. Married-son.  Pt up to date on colonoscopy.   Last physical with labs 3-4 yeas ago.     Review of Systems  Constitutional:  Negative for chills, fatigue and fever.  HENT:  Negative for congestion and drooling.   Respiratory:  Negative for cough, chest tightness, shortness of breath and wheezing.   Cardiovascular:  Negative for chest pain and palpitations.  Gastrointestinal:  Negative for abdominal pain, blood in stool and constipation.       Reflux symptoms see hpi.  Musculoskeletal:  Negative for back pain, joint swelling and neck pain.  Skin:  Negative for rash.  Neurological:  Negative for dizziness and headaches.  Hematological:  Negative for adenopathy. Does not bruise/bleed easily.  Psychiatric/Behavioral:  Negative for behavioral problems and decreased concentration.     Past Medical History:  Diagnosis Date   Vitiligo      Social History   Socioeconomic History   Marital status: Married    Spouse name: Not on file   Number of children: Not on file   Years of education: Not on file   Highest education level: Not on file  Occupational History   Occupation: [employer]  w/s  Tobacco Use   Smoking status: Never   Smokeless tobacco: Never  Substance and Sexual Activity   Alcohol use: Yes    Alcohol/week: 14.0 standard drinks    Types: 14 drink(s) per week   Drug use: No   Sexual activity: Yes    Partners: Female  Other Topics Concern   Not on file  Social History Narrative   Exercise--  Walking/ jogging   Social Determinants of Health   Financial Resource Strain: Not on file   Food Insecurity: Not on file  Transportation Needs: Not on file  Physical Activity: Not on file  Stress: Not on file  Social Connections: Not on file  Intimate Partner Violence: Not on file    No past surgical history on file.  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   No current facility-administered medications on file prior to visit.    BP 122/78 (BP Location: Left Arm, Patient Position: Sitting, Cuff Size: Normal)   Pulse 64   Resp 18   Ht 6' (1.829 m)   Wt 228 lb 6.4 oz (103.6 kg)   SpO2 95%   BMI 30.98 kg/m       Objective:   Physical Exam   General Mental Status- Alert. General Appearance- Not in acute distress.   Skin  General: Color- Normal Color. Moisture- Normal Moisture.  Neck Carotid Arteries- Normal color. Moisture- Normal Moisture. No carotid bruits. No JVD.  Chest and Lung Exam Auscultation: Breath Sounds:-Normal.  Cardiovascular Auscultation:Rythm- Regular. Murmurs & Other Heart Sounds:Auscultation of the heart reveals- No Murmurs.  Abdomen Inspection:-Inspeection Normal. Palpation/Percussion:Note:No mass. Palpation and Percussion of the abdomen reveal- Non Tender, Non Distended + BS, no rebound or guarding.   Neurologic Cranial Nerve exam:- CN III-XII intact(No nystagmus), symmetric smile. Strength:- 5/5 equal and symmetric strength both upper and lower extremities.      Assessment & Plan:  For you wellness exam today I have ordered cbc, cmp, lipid panel and psa  Vaccine up to  date but recommend ask insurance if they cover shingles vaccine.  Recommend exercise and healthy diet.  We will let you know lab results as they come in.  Follow up date appointment will be determined after lab review.    For ED rx viagra 100 mg dose.   For reflux/gerd recommend healthy diet and can use famotadine. If symptoms persist let us know. Could refer to gi if symptoms persist.  Esperanza Richters, PA-C

## 2021-06-28 ENCOUNTER — Encounter: Payer: Self-pay | Admitting: Medical

## 2021-06-29 MED ORDER — SILDENAFIL CITRATE 100 MG PO TABS: 50.0000 mg | ORAL_TABLET | Freq: Every day | ORAL | 3 refills | Status: DC | PRN

## 2021-07-02 ENCOUNTER — Other Ambulatory Visit: Payer: Self-pay | Admitting: Medical

## 2021-07-02 ENCOUNTER — Other Ambulatory Visit: Payer: Self-pay

## 2021-07-02 ENCOUNTER — Other Ambulatory Visit (INDEPENDENT_AMBULATORY_CARE_PROVIDER_SITE_OTHER): Payer: No Typology Code available for payment source

## 2021-07-02 DIAGNOSIS — Z0000 Encounter for general adult medical examination without abnormal findings: Secondary | ICD-10-CM | POA: Diagnosis not present

## 2021-07-02 DIAGNOSIS — Z125 Encounter for screening for malignant neoplasm of prostate: Secondary | ICD-10-CM | POA: Diagnosis not present

## 2021-07-02 LAB — COMPREHENSIVE METABOLIC PANEL
ALT: 21 U/L (ref 0–53)
AST: 15 U/L (ref 0–37)
Albumin: 4 g/dL (ref 3.5–5.2)
Alkaline Phosphatase: 49 U/L (ref 39–117)
BUN: 16 mg/dL (ref 6–23)
CO2: 29 mEq/L (ref 19–32)
Calcium: 8.9 mg/dL (ref 8.4–10.5)
Chloride: 106 mEq/L (ref 96–112)
Creatinine, Ser: 0.92 mg/dL (ref 0.40–1.50)
GFR: 94.61 mL/min (ref >60.00)
Glucose, Bld: 96 mg/dL (ref 70–99)
Potassium: 4.7 mEq/L (ref 3.5–5.1)
Sodium: 141 mEq/L (ref 135–145)
Total Bilirubin: 0.6 mg/dL (ref 0.2–1.2)
Total Protein: 5.9 g/dL — ABNORMAL LOW (ref 6.0–8.3)

## 2021-07-02 LAB — CBC WITH DIFFERENTIAL/PLATELET
Basophils Absolute: 0 10*3/uL (ref 0.0–0.1)
Basophils Relative: 0.2 % (ref 0.0–3.0)
Eosinophils Absolute: 0.1 10*3/uL (ref 0.0–0.7)
Eosinophils Relative: 1.6 % (ref 0.0–5.0)
HCT: 43.3 % (ref 39.0–52.0)
Hemoglobin: 14.5 g/dL (ref 13.0–17.0)
Lymphocytes Relative: 31.6 % (ref 12.0–46.0)
Lymphs Abs: 1.6 10*3/uL (ref 0.7–4.0)
MCHC: 33.5 g/dL (ref 30.0–36.0)
MCV: 99.7 fl (ref 78.0–100.0)
Monocytes Absolute: 0.4 10*3/uL (ref 0.1–1.0)
Monocytes Relative: 8.4 % (ref 3.0–12.0)
Neutro Abs: 2.9 10*3/uL (ref 1.4–7.7)
Neutrophils Relative %: 58.2 % (ref 43.0–77.0)
Platelets: 243 10*3/uL (ref 150.0–400.0)
RBC: 4.34 Mil/uL (ref 4.22–5.81)
RDW: 13.1 % (ref 11.5–15.5)
WBC: 4.9 10*3/uL (ref 4.0–10.5)

## 2021-07-02 LAB — LIPID PANEL
Cholesterol: 183 mg/dL (ref 0–200)
HDL: 56.4 mg/dL (ref >39.00)
LDL Cholesterol: 117 mg/dL — ABNORMAL HIGH (ref 0–99)
NonHDL: 126.2
Total CHOL/HDL Ratio: 3
Triglycerides: 48 mg/dL (ref 0.0–149.0)
VLDL: 9.6 mg/dL (ref 0.0–40.0)

## 2021-07-02 LAB — PSA: PSA: 1.21 ng/mL (ref 0.10–4.00)

## 2021-07-16 ENCOUNTER — Other Ambulatory Visit: Payer: Self-pay | Admitting: Medical

## 2021-12-18 ENCOUNTER — Other Ambulatory Visit: Payer: Self-pay | Admitting: Medical

## 2021-12-18 ENCOUNTER — Encounter: Payer: Self-pay | Admitting: Medical

## 2021-12-20 MED ORDER — SILDENAFIL CITRATE 100 MG PO TABS: 50.0000 mg | ORAL_TABLET | Freq: Every day | ORAL | 3 refills | Status: DC | PRN

## 2021-12-20 MED ORDER — FAMOTIDINE 20 MG PO TABS: 20.0000 mg | ORAL_TABLET | Freq: Two times a day (BID) | ORAL | 0 refills | Status: DC

## 2021-12-28 MED ORDER — SILDENAFIL CITRATE 100 MG PO TABS: 50.0000 mg | ORAL_TABLET | Freq: Every day | ORAL | 3 refills | Status: AC | PRN

## 2021-12-28 NOTE — Addendum Note (Signed)
Addended by: Maximino Sarin on: 12/28/2021 08:35 AM ? ? Modules accepted: Orders ? ?

## 2022-03-23 ENCOUNTER — Ambulatory Visit (INDEPENDENT_AMBULATORY_CARE_PROVIDER_SITE_OTHER): Payer: No Typology Code available for payment source | Admitting: Medical

## 2022-03-23 VITALS — BP 130/90 | HR 60 | Temp 98.5°F | Resp 18 | Ht 72.0 in | Wt 243.6 lb

## 2022-03-23 DIAGNOSIS — K219 Gastro-esophageal reflux disease without esophagitis: Secondary | ICD-10-CM | POA: Diagnosis not present

## 2022-03-23 DIAGNOSIS — R1013 Epigastric pain: Secondary | ICD-10-CM | POA: Diagnosis not present

## 2022-03-23 LAB — CBC WITH DIFFERENTIAL/PLATELET
Basophils Absolute: 0 10*3/uL (ref 0.0–0.1)
Basophils Relative: 0.3 % (ref 0.0–3.0)
Eosinophils Absolute: 0.6 10*3/uL (ref 0.0–0.7)
Eosinophils Relative: 10.5 % — ABNORMAL HIGH (ref 0.0–5.0)
HCT: 46.6 % (ref 39.0–52.0)
Hemoglobin: 15.4 g/dL (ref 13.0–17.0)
Lymphocytes Relative: 32.5 % (ref 12.0–46.0)
Lymphs Abs: 1.9 10*3/uL (ref 0.7–4.0)
MCHC: 33.1 g/dL (ref 30.0–36.0)
MCV: 99.5 fl (ref 78.0–100.0)
Monocytes Absolute: 0.5 10*3/uL (ref 0.1–1.0)
Monocytes Relative: 8.6 % (ref 3.0–12.0)
Neutro Abs: 2.8 10*3/uL (ref 1.4–7.7)
Neutrophils Relative %: 48.1 % (ref 43.0–77.0)
Platelets: 254 10*3/uL (ref 150.0–400.0)
RBC: 4.68 Mil/uL (ref 4.22–5.81)
RDW: 13.3 % (ref 11.5–15.5)
WBC: 5.9 10*3/uL (ref 4.0–10.5)

## 2022-03-23 LAB — COMPREHENSIVE METABOLIC PANEL
ALT: 51 U/L (ref 0–53)
AST: 28 U/L (ref 0–37)
Albumin: 4.5 g/dL (ref 3.5–5.2)
Alkaline Phosphatase: 52 U/L (ref 39–117)
BUN: 15 mg/dL (ref 6–23)
CO2: 28 mEq/L (ref 19–32)
Calcium: 9.3 mg/dL (ref 8.4–10.5)
Chloride: 105 mEq/L (ref 96–112)
Creatinine, Ser: 0.94 mg/dL (ref 0.40–1.50)
GFR: 91.74 mL/min (ref >60.00)
Glucose, Bld: 89 mg/dL (ref 70–99)
Potassium: 4.9 mEq/L (ref 3.5–5.1)
Sodium: 141 mEq/L (ref 135–145)
Total Bilirubin: 0.8 mg/dL (ref 0.2–1.2)
Total Protein: 6.6 g/dL (ref 6.0–8.3)

## 2022-03-23 LAB — LIPASE: Lipase: 6 U/L — ABNORMAL LOW (ref 11.0–59.0)

## 2022-03-23 MED ORDER — HYOSCYAMINE SULFATE 0.125 MG SL SUBL: 0.1250 mg | SUBLINGUAL_TABLET | Freq: Once | SUBLINGUAL | Status: AC

## 2022-03-23 MED ORDER — ALUM & MAG HYDROXIDE-SIMETH 200-200-20 MG/5ML PO SUSP: 30.0000 mL | Freq: Once | ORAL | Status: AC

## 2022-03-23 MED ORDER — OMEPRAZOLE 40 MG PO CPDR: 40.0000 mg | DELAYED_RELEASE_CAPSULE | Freq: Every day | ORAL | 3 refills | Status: DC

## 2022-03-23 MED ORDER — LIDOCAINE VISCOUS HCL 2 % MT SOLN: 15.0000 mL | Freq: Once | OROMUCOSAL | Status: AC

## 2022-03-23 NOTE — Patient Instructions (Addendum)
By history and recent symptoms appear flare of gerd. We gave you GI cocktail today. Recommend eating healthy, avoid alcohol, continue famotadine and adding omeprazole.   Will get cbc, cmp, lipase and h pylori igg antibody study.  After lab review will decide on GI MD referral.  Expect your symptoms to gradually improve. If not let me know before the weekend. Could add additonal medication called carafate.  Follow up in 10-14 days or sooner if needed

## 2022-03-23 NOTE — Addendum Note (Signed)
Addended by: Maximino Sarin on: 03/23/2022 11:35 AM   Modules accepted: Orders

## 2022-03-23 NOTE — Progress Notes (Signed)
Subjective:    Patient ID: Cody Castro, male    DOB: 12-18-1965, 56 y.o.   MRN: [id number]  HPI  Pt has been feeling bloated and having worse heart burn for 3 weeks. But since April symptoms on and off.  Describes a lot of burping, constant upset stomach and burning sensation past 3 weeks. Most of time pain in epigastric area. Will up to chest at times though not contant and not present today. Rare can feel acid like sensation in his throat.   Denies chest pain on review or associated cardiac like symptoms.   Pt states he attempted to avoid tomato and alcohol. This has not helped.   Pt definitley notes association with meals.   He denies and chest pain.  On last visit pt mentioned gerd symptoms for one year. He was getting symptoms 3 days a week. He mentioned for month symptoms improved and for months until April symptoms were very rare. In April he restarted prior famotadine. Since early May taking med twice a day.   Last bm yesterday. Denies constipation.   Present symptoms are feeling bloated only.  Overall gerd symptoms for 2.5 years.  In past was averging 4 glasses of wine a day. Now reduced to 0-2 glasses of wine.   On review no hx of h pylori.   Review of Systems  Constitutional:  Negative for chills, fatigue and fever.  Respiratory:  Negative for cough, chest tightness, shortness of breath and wheezing.   Cardiovascular:  Negative for chest pain and palpitations.  Gastrointestinal:  Positive for abdominal pain. Negative for abdominal distention, blood in stool, constipation, diarrhea, nausea and vomiting.  Genitourinary:  Negative for dysuria, flank pain and frequency.  Musculoskeletal:  Negative for back pain, myalgias and neck stiffness.  Skin:  Negative for rash.    Past Medical History:  Diagnosis Date   Vitiligo      Social History   Socioeconomic History   Marital status: Married    Spouse name: Not on file   Number of children: Not on file    Years of education: Not on file   Highest education level: Not on file  Occupational History   Occupation: [employer]  w/s  Tobacco Use   Smoking status: Never   Smokeless tobacco: Never  Substance and Sexual Activity   Alcohol use: Yes    Alcohol/week: 14.0 standard drinks    Types: 14 drink(s) per week   Drug use: No   Sexual activity: Yes    Partners: Female  Other Topics Concern   Not on file  Social History Narrative   Exercise--  Walking/ jogging   Social Determinants of Health   Financial Resource Strain: Not on file  Food Insecurity: Not on file  Transportation Needs: Not on file  Physical Activity: Not on file  Stress: Not on file  Social Connections: Not on file  Intimate Partner Violence: Not on file    No past surgical history on file.  Family History  Problem Relation Age of Onset   Hypertension Mother    Depression Mother    Depression Sister    Hypertension Sister    Cancer Maternal Aunt 30       breast   Heart disease Paternal Grandfather    Parkinsonism Paternal Grandfather    Alzheimer's disease Paternal Grandfather    Deep vein thrombosis Paternal Grandfather     Allergies  Allergen Reactions   Aspirin Other (See Comments)    Duodenal Ulcer @  age 40    Current Outpatient Medications on File Prior to Visit  Medication Sig Dispense Refill   famotidine (PEPCID) 20 MG tablet Take 1 tablet (20 mg total) by mouth 2 (two) times daily. 180 tablet 0   sildenafil (VIAGRA) 100 MG tablet Take 0.5-1 tablets (50-100 mg total) by mouth daily as needed for erectile dysfunction. 10 tablet 3   No current facility-administered medications on file prior to visit.    BP 130/90   Pulse 60   Temp 98.5 F (36.9 C)   Resp 18   Ht 6' (1.829 m)   Wt 243 lb 9.6 oz (110.5 kg)   SpO2 97%   BMI 33.04 kg/m       Objective:   Physical Exam  General- No acute distress. Pleasant patient. Neck- Full range of motion, no jvd Lungs- Clear, even and  unlabored. Heart- regular rate and rhythm. Neurologic- CNII- XII grossly intact.  Abdomen- +bs, no rebound or guarding and no organomegaly. Faint epigastric tenderness.  Back- no cva tenderness.     Assessment & Plan:   Patient Instructions  By history and recent symptoms appear flare of gerd. We gave you GI cocktail today. Recommend eating healthy, avoid alcohol, continue famotadine and adding omeprazole.   Will get cbc, cmp, lipase and h pylori igg antibody study.  After lab review will decide on GI MD referral.  Expect your symptoms to gradually improve. If not let me know before the weekend. Could add additonal medication called carafate.  Follow up in 10-14 days or sooner if needed    [employer], PA-C

## 2022-03-24 LAB — H. PYLORI ANTIBODY, IGG: H Pylori IgG: NEGATIVE

## 2022-03-24 NOTE — Addendum Note (Signed)
Addended by: Gwenevere Abbot on: 03/24/2022 02:13 PM   Modules accepted: Orders

## 2022-03-27 ENCOUNTER — Encounter: Payer: Self-pay | Admitting: Medical

## 2022-03-31 ENCOUNTER — Encounter: Payer: Self-pay | Admitting: Gastroenterology

## 2022-04-05 ENCOUNTER — Encounter: Payer: Self-pay | Admitting: Medical

## 2022-04-05 ENCOUNTER — Ambulatory Visit (INDEPENDENT_AMBULATORY_CARE_PROVIDER_SITE_OTHER): Payer: No Typology Code available for payment source | Admitting: Medical

## 2022-04-05 VITALS — BP 118/70 | HR 57 | Temp 97.7°F | Resp 16 | Ht 72.0 in | Wt 240.6 lb

## 2022-04-05 DIAGNOSIS — R1013 Epigastric pain: Secondary | ICD-10-CM

## 2022-04-05 DIAGNOSIS — K219 Gastro-esophageal reflux disease without esophagitis: Secondary | ICD-10-CM | POA: Diagnosis not present

## 2022-04-05 MED ORDER — FAMOTIDINE 20 MG PO TABS: 20.0000 mg | ORAL_TABLET | Freq: Two times a day (BID) | ORAL | 0 refills | Status: DC

## 2022-04-05 NOTE — Progress Notes (Signed)
Subjective:    Patient ID: Cody Castro, male    DOB: [birth year], 56 y.o.   MRN: [id number]  HPI Pt in for follow up from last visit below in " last visit hpi.  "By history and recent symptoms appear flare of gerd. We gave you GI cocktail today. Recommend eating healthy, avoid alcohol, continue famotadine and adding omeprazole.    Will get cbc, cmp, lipase and h pylori igg antibody study.   After lab review will decide on GI MD referral.   Expect your symptoms to gradually improve. If not let me know before the weekend. Could add additonal medication called carafate."   I had placed referral to GI office gave pt number to GI office on my chart.   Pt states with add on omeprazole his symptoms are 70%.  Pt has gi appointment within next 2 weeks.  Review of Systems  Constitutional:  Negative for chills, fatigue and fever.  Respiratory:  Negative for cough, chest tightness, shortness of breath and wheezing.   Cardiovascular:  Negative for chest pain and palpitations.  Gastrointestinal:  Positive for abdominal pain. Negative for abdominal distention, blood in stool, constipation, diarrhea, nausea, rectal pain and vomiting.  Genitourinary:  Positive for urgency. Negative for dysuria, flank pain and frequency.  Musculoskeletal:  Negative for back pain, joint swelling and neck pain.  Skin:  Negative for rash.    Past Medical History:  Diagnosis Date   Vitiligo      Social History   Socioeconomic History   Marital status: Married    Spouse name: Not on file   Number of children: Not on file   Years of education: Not on file   Highest education level: Not on file  Occupational History   Occupation: [employer]  w/s  Tobacco Use   Smoking status: Never   Smokeless tobacco: Never  Substance and Sexual Activity   Alcohol use: Yes    Alcohol/week: 14.0 standard drinks of alcohol    Types: 14 drink(s) per week   Drug use: No   Sexual activity: Yes    Partners: Female  Other  Topics Concern   Not on file  Social History Narrative   Exercise--  Walking/ jogging   Social Determinants of Health   Financial Resource Strain: Not on file  Food Insecurity: Not on file  Transportation Needs: Not on file  Physical Activity: Not on file  Stress: Not on file  Social Connections: Not on file  Intimate Partner Violence: Not on file    No past surgical history on file.  Family History  Problem Relation Age of Onset   Hypertension Mother    Depression Mother    Depression Sister    Hypertension Sister    Cancer Maternal Aunt 30       breast   Heart disease Paternal Grandfather    Parkinsonism Paternal Grandfather    Alzheimer's disease Paternal Grandfather    Deep vein thrombosis Paternal Grandfather     Allergies  Allergen Reactions   Aspirin Other (See Comments)    Duodenal Ulcer @ age 26    Current Outpatient Medications on File Prior to Visit  Medication Sig Dispense Refill   famotidine (PEPCID) 20 MG tablet Take 1 tablet (20 mg total) by mouth 2 (two) times daily. 180 tablet 0   omeprazole (PRILOSEC) 40 MG capsule Take 1 capsule (40 mg total) by mouth daily. 30 capsule 3   sildenafil (VIAGRA) 100 MG tablet Take 0.5-1 tablets (50-100  mg total) by mouth daily as needed for erectile dysfunction. 10 tablet 3   Current Facility-Administered Medications on File Prior to Visit  Medication Dose Route Frequency Provider Last Rate Last Admin   alum & mag hydroxide-simeth (MAALOX/MYLANTA) 200-200-20 MG/5ML suspension 30 mL  30 mL Oral Once Villa Burgin, PA-C       hyoscyamine (LEVSIN SL) SL tablet 0.125 mg  0.125 mg Oral Once Baylea Milburn, PA-C       lidocaine (XYLOCAINE) 2 % viscous mouth solution 15 mL  15 mL Mouth/Throat Once Denisha Hoel, PA-C        BP (!) 145/82 (BP Location: Right Arm, Patient Position: Sitting, Cuff Size: Normal)   Pulse (!) 57   Temp 97.7 F (36.5 C) (Oral)   Resp 16   Ht 6' (1.829 m)   Wt 240 lb 9.6 oz (109.1 kg)    SpO2 96%   BMI 32.63 kg/m        Objective:   Physical Exam  General Mental Status- Alert. General Appearance- Not in acute distress.   Skin General: Color- Normal Color. Moisture- Normal Moisture.  Neck Carotid Arteries- Normal color. Moisture- Normal Moisture. No carotid bruits. No JVD.  Chest and Lung Exam Auscultation: Breath Sounds:-Normal.  Cardiovascular Auscultation:Rythm- Regular. Murmurs & Other Heart Sounds:Auscultation of the heart reveals- No Murmurs.  Abdomen Inspection:-Inspeection Normal. Palpation/Percussion:Note:No mass. Palpation and Percussion of the abdomen reveal- Non Tender, Non Distended + BS, no rebound or guarding.   Neurologic Cranial Nerve exam:- CN III-XII intact(No nystagmus), symmetric smile. Strength:- 5/5 equal and symmetric strength both upper and lower extremities.       Assessment & Plan:   Patient Instructions  Genella Rife symptoms much improved with famotadine and omeprazole combination. Glad you have appointment with gi MD upcoming. If you have recurrent prior symptoms like last visit can add on carafate. If you need that send me my chart message.  Blood pressure better on recheck.  Follow up Sept-0ctober wellness exam or sooner if needed.    Esperanza Richters, PA-C

## 2022-04-05 NOTE — Patient Instructions (Addendum)
Gerd symptoms much improved with famotadine and omeprazole combination. Glad you have appointment with gi MD upcoming. If you have recurrent prior symptoms like last visit can add on carafate. If you need that send me my chart message.  Blood pressure better on recheck.  Follow up Sept-0ctober wellness exam or sooner if needed.

## 2022-05-02 ENCOUNTER — Ambulatory Visit (INDEPENDENT_AMBULATORY_CARE_PROVIDER_SITE_OTHER): Payer: No Typology Code available for payment source | Admitting: Gastroenterology

## 2022-05-02 ENCOUNTER — Encounter: Payer: Self-pay | Admitting: Gastroenterology

## 2022-05-02 VITALS — BP 132/88 | HR 60 | Ht 72.0 in | Wt 245.0 lb

## 2022-05-02 DIAGNOSIS — K219 Gastro-esophageal reflux disease without esophagitis: Secondary | ICD-10-CM | POA: Diagnosis not present

## 2022-05-02 NOTE — Patient Instructions (Signed)
If you are age 55 or older, your body mass index should be between 23-30. Your Body mass index is 33.23 kg/m. If this is out of the aforementioned range listed, please consider follow up with your Primary Care Provider.  If you are age 74 or younger, your body mass index should be between 19-25. Your Body mass index is 33.23 kg/m. If this is out of the aformentioned range listed, please consider follow up with your Primary Care Provider.   Continue Pepcid 1-2 time a day.  The [facility] providers would like to encourage you to use [facility] to communicate with providers for non-urgent requests or questions.  Due to long hold times on the telephone, sending your provider a message by [facility] may be a faster and more efficient way to get a response.  Please allow 48 business hours for a response.  Please remember that this is for non-urgent requests.   It was a pleasure to see you today!  Thank you for trusting me with your gastrointestinal care!    Scott E.Tomasa Rand, MD

## 2022-05-02 NOTE — Progress Notes (Unsigned)
HPI : Cody Castro is a very pleasant 56 year old male with a history of vitiligo who is referred to [place] by Esperanza Richters PA-C for further evaluation of GERD symptoms.  The patient states that he has had infrequent and minor GERD symptoms for many years, but for the past few months his symptoms have become much more bothersome.  He describes his symptoms as a burning discomfort in the upper abdomen, sometimes going into the chest and throat, often associated with acid regurgitation.  He is also describes excessive belching and chronic bloating/abdominal discomfort. The symptoms did not seem to vary much with what he ate.  He did not have bothersome nighttime symptoms and denies being awoken from sleep because of his symptoms.  No problems with nausea or vomiting.  No dysphagia. He started taking omeprazole which significantly improved all of the symptoms.  Review of electronic medical record indicates that the patient has gained 20 pounds in the past year. He has no family history of GI malignancy. He has never had an upper endoscopy, but he reports being diagnosed with a duodenal ulcer at the age of 28 or 89.  This was apparently diagnosed via a barium swallow. He has had a colonoscopy in 2021 which was performed by the [facility] in [place], [place].  He believes that he had some polyps removed and was recommended to repeat in 5 years He has regular bowel movements.  No problems with lower abdominal pain, constipation, diarrhea or blood in the stool.  Past Medical History:  Diagnosis Date   Vitiligo     [facility] [place] [place] polyps (details not available), recommended repeat 5 years History reviewed. No pertinent surgical history. Family History  Problem Relation Age of Onset   Hypertension Mother    Depression Mother    Depression Sister    Hypertension Sister    Heart disease Paternal Grandfather    Parkinsonism Paternal Grandfather    Alzheimer's disease Paternal  Grandfather    Deep vein thrombosis Paternal Grandfather    Cancer Maternal Aunt 30       breast   Colon cancer Neg Hx    Esophageal cancer Neg Hx    Rectal cancer Neg Hx    Stomach cancer Neg Hx    Social History   Tobacco Use   Smoking status: Never   Smokeless tobacco: Never  Vaping Use   Vaping Use: Never used  Substance Use Topics   Alcohol use: Yes    Alcohol/week: 14.0 standard drinks of alcohol    Types: 14 drink(s) per week   Drug use: No   Current Outpatient Medications  Medication Sig Dispense Refill   famotidine (PEPCID) 20 MG tablet Take 1 tablet (20 mg total) by mouth 2 (two) times daily. 180 tablet 0   omeprazole (PRILOSEC) 40 MG capsule Take 1 capsule (40 mg total) by mouth daily. 30 capsule 3   sildenafil (VIAGRA) 100 MG tablet Take 0.5-1 tablets (50-100 mg total) by mouth daily as needed for erectile dysfunction. 10 tablet 3   Current Facility-Administered Medications  Medication Dose Route Frequency Provider Last Rate Last Admin   alum & mag hydroxide-simeth (MAALOX/MYLANTA) 200-200-20 MG/5ML suspension 30 mL  30 mL Oral Once Saguier, Edward, PA-C       hyoscyamine (LEVSIN SL) SL tablet 0.125 mg  0.125 mg Oral Once Saguier, Edward, PA-C       lidocaine (XYLOCAINE) 2 % viscous mouth solution 15 mL  15 mL Mouth/Throat Once Saguier, Ramon Dredge,  PA-C       Allergies  Allergen Reactions   Aspirin Other (See Comments)    Duodenal Ulcer @ age 47     Review of Systems: All systems reviewed and negative except where noted in HPI.    No results found.  Physical Exam: BP 132/88   Pulse 60   Ht 6' (1.829 m)   Wt 245 lb (111.1 kg)   SpO2 97%   BMI 33.23 kg/m  Constitutional: Pleasant,well-developed, Caucasian male in no acute distress. HEENT: Normocephalic and atraumatic. Conjunctivae are normal. No scleral icterus. Neck supple.  Cardiovascular: Normal rate, regular rhythm.  Pulmonary/chest: Effort normal and breath sounds normal. No wheezing, rales or  rhonchi. Abdominal: Soft, nondistended, nontender. Bowel sounds active throughout. There are no masses palpable. No hepatomegaly. Extremities: no edema Neurological: Alert and oriented to person place and time. Skin: Skin is warm and dry.  Skin hypopigmentation consistent with vitiligo noted on face, neck and upper extremities Psychiatric: Normal mood and affect. Behavior is normal.  CBC    Component Value Date/Time   WBC 5.9 03/23/2022 1136   RBC 4.68 03/23/2022 1136   HGB 15.4 03/23/2022 1136   HCT 46.6 03/23/2022 1136   PLT 254.0 03/23/2022 1136   MCV 99.5 03/23/2022 1136   MCHC 33.1 03/23/2022 1136   RDW 13.3 03/23/2022 1136   LYMPHSABS 1.9 03/23/2022 1136   MONOABS 0.5 03/23/2022 1136   EOSABS 0.6 03/23/2022 1136   BASOSABS 0.0 03/23/2022 1136    CMP     Component Value Date/Time   NA 141 03/23/2022 1136   K 4.9 03/23/2022 1136   CL 105 03/23/2022 1136   CO2 28 03/23/2022 1136   GLUCOSE 89 03/23/2022 1136   BUN 15 03/23/2022 1136   CREATININE 0.94 03/23/2022 1136   CALCIUM 9.3 03/23/2022 1136   PROT 6.6 03/23/2022 1136   ALBUMIN 4.5 03/23/2022 1136   AST 28 03/23/2022 1136   ALT 51 03/23/2022 1136   ALKPHOS 52 03/23/2022 1136   BILITOT 0.8 03/23/2022 1136   GFRNONAA 108.89 08/20/2010 0959   GFRAA 106 09/24/2007 1201     ASSESSMENT AND PLAN: 56 year old male with typical GERD symptoms which increased in frequency and severity in the past few months in the setting of weight gain, improved with PPI.  We discussed the role of upper endoscopy in evaluating GERD symptoms in patients.  Given that he had GERD symptoms in the past, which worsened in the setting of weight gain, but then improved with appropriate medical therapy, I do not think that an upper endoscopy is absolutely necessary.  He has no concerning signs or symptoms such as dysphagia, unintentional weight loss or anemia. We discussed the pathophysiology of GERD and the principles of GERD management to include  lifestyle modifications  such as dietary discretion (avoidance of alcohol, tobacco, caffeinated and carbonated beverages, spicy/greasy foods, citrus, peppermint/chocolate), weight loss if applicable, head of bed elevation andconsuming last meal of day within 3 hours of bedtime; pharmacologic options to include PPIs, H2RAs and OTC antacids; and finally surgical or endoscopic fundoplication. For now, I recommended he continue to take omeprazole daily, and use Pepcid as needed for breakthrough symptoms.  Regarding colon cancer screening, we will request records from the [facility] in [place] to ensure that he is up-to-date and following guidelines for colon cancer screening.  GERD - Continue omeprazole daily, take Pepcid as needed - No absolute indication for endoscopy at this time  Colon cancer screening - Obtain records from previous  colonoscopy  Aberdeen Hafen E. Tomasa Rand, MD [facility]   Saguier, Ramon Dredge, [place]

## 2022-06-28 ENCOUNTER — Encounter: Payer: Self-pay | Admitting: Medical

## 2022-06-28 ENCOUNTER — Ambulatory Visit (INDEPENDENT_AMBULATORY_CARE_PROVIDER_SITE_OTHER): Payer: No Typology Code available for payment source | Admitting: Medical

## 2022-06-28 VITALS — BP 130/80 | HR 62 | Temp 98.0°F | Resp 18 | Ht 72.0 in | Wt 238.4 lb

## 2022-06-28 DIAGNOSIS — M25562 Pain in left knee: Secondary | ICD-10-CM

## 2022-06-28 DIAGNOSIS — G8929 Other chronic pain: Secondary | ICD-10-CM

## 2022-06-28 LAB — CBC WITH DIFFERENTIAL/PLATELET
Basophils Absolute: 0 10*3/uL (ref 0.0–0.1)
Basophils Relative: 0.2 % (ref 0.0–3.0)
Eosinophils Absolute: 0 10*3/uL (ref 0.0–0.7)
Eosinophils Relative: 0.8 % (ref 0.0–5.0)
HCT: 44.6 % (ref 39.0–52.0)
Hemoglobin: 14.8 g/dL (ref 13.0–17.0)
Lymphocytes Relative: 29.8 % (ref 12.0–46.0)
Lymphs Abs: 1.6 10*3/uL (ref 0.7–4.0)
MCHC: 33.2 g/dL (ref 30.0–36.0)
MCV: 100.1 fl — ABNORMAL HIGH (ref 78.0–100.0)
Monocytes Absolute: 0.4 10*3/uL (ref 0.1–1.0)
Monocytes Relative: 7.6 % (ref 3.0–12.0)
Neutro Abs: 3.2 10*3/uL (ref 1.4–7.7)
Neutrophils Relative %: 61.6 % (ref 43.0–77.0)
Platelets: 247 10*3/uL (ref 150.0–400.0)
RBC: 4.46 Mil/uL (ref 4.22–5.81)
RDW: 13.1 % (ref 11.5–15.5)
WBC: 5.2 10*3/uL (ref 4.0–10.5)

## 2022-06-28 LAB — URIC ACID: Uric Acid, Serum: 6.5 mg/dL (ref 4.0–7.8)

## 2022-06-28 NOTE — Progress Notes (Signed)
Subjective:    Patient ID: Cody Castro, male    DOB: 10-Jun-1966, 56 y.o.   MRN: [id number]  HPI  Pt in for follow up.  Pt has seen GI MD.  GERD - Continue omeprazole daily, take Pepcid as needed - No absolute indication for endoscopy at this time  Also recent knee pain for left knee pain for 3 weeks. Pt does report remote knee pain in past when hyperextended knee playing soccer in early 20's. No hx of any chronic knee pain. Pt states this pain came up after he felt walked more than usual. He states was walknig around campus at [employer] where he works presently.   Pt used icey hot and wears knee brace. Will use ibuprofen 2 200 mg tabs in am and pm.   At end of the day pain is about 6-7/10. Early in day pain level about 3/10.   No ibuprofen today.    Review of Systems  Constitutional:  Negative for chills, fatigue and fever.  Respiratory:  Negative for cough, chest tightness, shortness of breath and wheezing.   Cardiovascular:  Negative for chest pain and palpitations.  Gastrointestinal:  Negative for abdominal pain, diarrhea and rectal pain.  Musculoskeletal:  Negative for back pain.       Left knee pain.   Past Medical History:  Diagnosis Date   Vitiligo      Social History   Socioeconomic History   Marital status: Married    Spouse name: Not on file   Number of children: Not on file   Years of education: Not on file   Highest education level: Not on file  Occupational History   Occupation: [employer]  Tobacco Use   Smoking status: Never   Smokeless tobacco: Never  Vaping Use   Vaping Use: Never used  Substance and Sexual Activity   Alcohol use: Yes    Alcohol/week: 14.0 standard drinks of alcohol    Types: 14 drink(s) per week   Drug use: No   Sexual activity: Yes    Partners: Female  Other Topics Concern   Not on file  Social History Narrative   Exercise--  Walking/ jogging   Social Determinants of Health   Financial Resource  Strain: Not on file  Food Insecurity: Not on file  Transportation Needs: Not on file  Physical Activity: Not on file  Stress: Not on file  Social Connections: Not on file  Intimate Partner Violence: Not on file    No past surgical history on file.  Family History  Problem Relation Age of Onset   Hypertension Mother    Depression Mother    Depression Sister    Hypertension Sister    Heart disease Paternal Grandfather    Parkinsonism Paternal Grandfather    Alzheimer's disease Paternal Grandfather    Deep vein thrombosis Paternal Grandfather    Cancer Maternal Aunt 30       breast   Colon cancer Neg Hx    Esophageal cancer Neg Hx    Rectal cancer Neg Hx    Stomach cancer Neg Hx     Allergies  Allergen Reactions   Aspirin Other (See Comments)    Duodenal Ulcer @ age 62    Current Outpatient Medications on File Prior to Visit  Medication Sig Dispense Refill   famotidine (PEPCID) 20 MG tablet Take 1 tablet (20 mg total) by mouth 2 (two) times daily. 180 tablet 0   omeprazole (PRILOSEC) 40 MG  capsule Take 1 capsule (40 mg total) by mouth daily. 30 capsule 3   sildenafil (VIAGRA) 100 MG tablet Take 0.5-1 tablets (50-100 mg total) by mouth daily as needed for erectile dysfunction. 10 tablet 3   Current Facility-Administered Medications on File Prior to Visit  Medication Dose Route Frequency Provider Last Rate Last Admin   alum & mag hydroxide-simeth (MAALOX/MYLANTA) 200-200-20 MG/5ML suspension 30 mL  30 mL Oral Once Liandra Mendia, PA-C       hyoscyamine (LEVSIN SL) SL tablet 0.125 mg  0.125 mg Oral Once Rohith Fauth, PA-C       lidocaine (XYLOCAINE) 2 % viscous mouth solution 15 mL  15 mL Mouth/Throat Once Devany Aja, PA-C        BP 130/80   Pulse 62   Temp 98 F (36.7 C)   Resp 18   Ht 6' (1.829 m)   Wt 238 lb 6.4 oz (108.1 kg)   SpO2 99%   BMI 32.33 kg/m          Objective:   Physical Exam  General- No acute distress. Pleasant patient. Left knee  pain- pain on range of motion medial aspect and some on palpation of tibial plateau medially. Also pain superior to palpation of patella in patellar tendon area.      Assessment & Plan:     Patient Instructions  Left knee pain for 3 weeks. Will rx diclofenac today. Stop ibuprofen. Declined toradol IM injection.  Will get cbc and uric acid(evauluate differential dx with knew pain)  Will refer to sports medicine MD. Evaluate joint space/soft tissue.  Follow up date to be determined after sport med evaluation.

## 2022-06-28 NOTE — Patient Instructions (Addendum)
Left knee pain for 3 weeks. Will rx diclofenac today. Stop ibuprofen. Declined toradol IM injection.  Will get cbc and uric acid(evauluate differential dx with knew pain)  Will refer to sports medicine MD. Evaluate joint space/soft tissue.  Follow up date to be determined after sport med evaluation.

## 2022-06-29 ENCOUNTER — Encounter: Payer: Self-pay | Admitting: Medical

## 2022-07-01 MED ORDER — DICLOFENAC SODIUM 75 MG PO TBEC: 75.0000 mg | DELAYED_RELEASE_TABLET | Freq: Two times a day (BID) | ORAL | 0 refills | Status: DC

## 2022-07-01 NOTE — Addendum Note (Signed)
Addended by: Gwenevere Abbot on: 07/01/2022 09:30 AM   Modules accepted: Orders

## 2022-07-01 NOTE — Telephone Encounter (Signed)
Please send in diclofenac for pt

## 2022-07-07 ENCOUNTER — Encounter: Payer: Self-pay | Admitting: Family Medicine

## 2022-07-07 ENCOUNTER — Ambulatory Visit (INDEPENDENT_AMBULATORY_CARE_PROVIDER_SITE_OTHER): Payer: No Typology Code available for payment source | Admitting: Family Medicine

## 2022-07-07 ENCOUNTER — Ambulatory Visit: Payer: Self-pay

## 2022-07-07 VITALS — BP 112/84 | Ht 72.0 in | Wt 238.0 lb

## 2022-07-07 DIAGNOSIS — M23204 Derangement of unspecified medial meniscus due to old tear or injury, left knee: Secondary | ICD-10-CM | POA: Diagnosis not present

## 2022-07-07 DIAGNOSIS — M25562 Pain in left knee: Secondary | ICD-10-CM

## 2022-07-07 MED ORDER — PREDNISONE 5 MG PO TABS: ORAL_TABLET | ORAL | 0 refills | Status: DC

## 2022-07-07 NOTE — Progress Notes (Signed)
  Cody Castro - 56 y.o. male MRN [id number]  Date of birth: 21-Nov-1965  SUBJECTIVE:  Including CC & ROS.  No chief complaint on file.   Cody Castro is a 56 y.o. male that is presenting with acute left knee pain.  The pain has been ongoing for 3 weeks.  The pain is over the medial compartment.  No history of similar pain.  No improvement with modalities today.   Review of Systems See HPI   HISTORY: Past Medical, Surgical, Social, and Family History Reviewed & Updated per EMR.   Pertinent Historical Findings include:  Past Medical History:  Diagnosis Date   Vitiligo     History reviewed. No pertinent surgical history.   PHYSICAL EXAM:  VS: BP 112/84 (BP Location: Left Arm, Patient Position: Sitting)   Ht 6' (1.829 m)   Wt 238 lb (108 kg)   BMI 32.28 kg/m  Physical Exam Gen: NAD, alert, cooperative with exam, well-appearing MSK:  Neurovascularly intact    Limited ultrasound: Left knee:  No effusion suprapatellar pouch. Normal-appearing quadricep and patellar tendon. Normal-appearing lateral meniscus. Degenerative changes of the medial meniscus with focal effusion  Summary: Findings consistent with degenerative change of the medial meniscus  Ultrasound and interpretation by Clare Gandy, MD    ASSESSMENT & PLAN:   Degenerative tear of medial meniscus of left knee Acutely occurring.  Having local changes of the medial meniscus to suggest more of an irritation. -Counseled on home exercise therapy and supportive care. -Prednisone. -Counseled on compression. -Could consider physical therapy or further imaging.

## 2022-07-07 NOTE — Assessment & Plan Note (Signed)
Acutely occurring.  Having local changes of the medial meniscus to suggest more of an irritation. -Counseled on home exercise therapy and supportive care. -Prednisone. -Counseled on compression. -Could consider physical therapy or further imaging.

## 2022-07-07 NOTE — Patient Instructions (Signed)
Nice to meet you Please use ice as needed  Please consider compression  Please try the exercises   Please send me a message in MyChart with any questions or updates.  Please see me back in 3-4 weeks.   --Dr. Jordan Likes

## 2022-07-09 ENCOUNTER — Encounter: Payer: Self-pay | Admitting: Medical

## 2022-08-03 ENCOUNTER — Ambulatory Visit (INDEPENDENT_AMBULATORY_CARE_PROVIDER_SITE_OTHER): Payer: No Typology Code available for payment source | Admitting: Medical

## 2022-08-03 VITALS — BP 119/80 | HR 63 | Temp 98.0°F | Resp 18 | Ht 72.0 in | Wt 242.0 lb

## 2022-08-03 DIAGNOSIS — Z0000 Encounter for general adult medical examination without abnormal findings: Secondary | ICD-10-CM

## 2022-08-03 DIAGNOSIS — Z125 Encounter for screening for malignant neoplasm of prostate: Secondary | ICD-10-CM | POA: Diagnosis not present

## 2022-08-03 LAB — CBC WITH DIFFERENTIAL/PLATELET
Basophils Absolute: 0 10*3/uL (ref 0.0–0.1)
Basophils Relative: 0.2 % (ref 0.0–3.0)
Eosinophils Absolute: 0.1 10*3/uL (ref 0.0–0.7)
Eosinophils Relative: 2.6 % (ref 0.0–5.0)
HCT: 43.7 % (ref 39.0–52.0)
Hemoglobin: 14.6 g/dL (ref 13.0–17.0)
Lymphocytes Relative: 39.5 % (ref 12.0–46.0)
Lymphs Abs: 2 10*3/uL (ref 0.7–4.0)
MCHC: 33.3 g/dL (ref 30.0–36.0)
MCV: 99.9 fl (ref 78.0–100.0)
Monocytes Absolute: 0.5 10*3/uL (ref 0.1–1.0)
Monocytes Relative: 9.5 % (ref 3.0–12.0)
Neutro Abs: 2.4 10*3/uL (ref 1.4–7.7)
Neutrophils Relative %: 48.2 % (ref 43.0–77.0)
Platelets: 221 10*3/uL (ref 150.0–400.0)
RBC: 4.38 Mil/uL (ref 4.22–5.81)
RDW: 13.2 % (ref 11.5–15.5)
WBC: 5 10*3/uL (ref 4.0–10.5)

## 2022-08-03 LAB — COMPREHENSIVE METABOLIC PANEL
ALT: 42 U/L (ref 0–53)
AST: 23 U/L (ref 0–37)
Albumin: 4.2 g/dL (ref 3.5–5.2)
Alkaline Phosphatase: 49 U/L (ref 39–117)
BUN: 17 mg/dL (ref 6–23)
CO2: 29 mEq/L (ref 19–32)
Calcium: 9.2 mg/dL (ref 8.4–10.5)
Chloride: 104 mEq/L (ref 96–112)
Creatinine, Ser: 0.88 mg/dL (ref 0.40–1.50)
GFR: 97.06 mL/min (ref >60.00)
Glucose, Bld: 98 mg/dL (ref 70–99)
Potassium: 4.8 mEq/L (ref 3.5–5.1)
Sodium: 139 mEq/L (ref 135–145)
Total Bilirubin: 0.8 mg/dL (ref 0.2–1.2)
Total Protein: 6.3 g/dL (ref 6.0–8.3)

## 2022-08-03 LAB — LIPID PANEL
Cholesterol: 203 mg/dL — ABNORMAL HIGH (ref 0–200)
HDL: 53.3 mg/dL (ref >39.00)
LDL Cholesterol: 133 mg/dL — ABNORMAL HIGH (ref 0–99)
NonHDL: 149.81
Total CHOL/HDL Ratio: 4
Triglycerides: 84 mg/dL (ref 0.0–149.0)
VLDL: 16.8 mg/dL (ref 0.0–40.0)

## 2022-08-03 LAB — PSA: PSA: 2.1 ng/mL (ref 0.10–4.00)

## 2022-08-03 NOTE — Patient Instructions (Addendum)
For you wellness exam today I have ordered cbc, cmp and lipid panel. Psa for screening as well.  Up to date on flu vaccine. Can get covid vaccine down stairs.  Recommend exercise and healthy diet.  We will let you know lab results as they come in.  Follow up date appointment will be determined after lab review.      Preventive Care 74-55 Years Old, Male Preventive care refers to lifestyle choices and visits with your health care provider that can promote health and wellness. Preventive care visits are also called wellness exams. What can I expect for my preventive care visit? Counseling During your preventive care visit, your health care provider may ask about your: Medical history, including: Past medical problems. Family medical history. Current health, including: Emotional well-being. Home life and relationship well-being. Sexual activity. Lifestyle, including: Alcohol, nicotine or tobacco, and drug use. Access to firearms. Diet, exercise, and sleep habits. Safety issues such as seatbelt and bike helmet use. Sunscreen use. Work and work Astronomer. Physical exam Your health care provider will check your: Height and weight. These may be used to calculate your BMI (body mass index). BMI is a measurement that tells if you are at a healthy weight. Waist circumference. This measures the distance around your waistline. This measurement also tells if you are at a healthy weight and may help predict your risk of certain diseases, such as type 2 diabetes and high blood pressure. Heart rate and blood pressure. Body temperature. Skin for abnormal spots. What immunizations do I need?  Vaccines are usually given at various ages, according to a schedule. Your health care provider will recommend vaccines for you based on your age, medical history, and lifestyle or other factors, such as travel or where you work. What tests do I need? Screening Your health care provider may recommend  screening tests for certain conditions. This may include: Lipid and cholesterol levels. Diabetes screening. This is done by checking your blood sugar (glucose) after you have not eaten for a while (fasting). Hepatitis B test. Hepatitis C test. HIV (human immunodeficiency virus) test. STI (sexually transmitted infection) testing, if you are at risk. Lung cancer screening. Prostate cancer screening. Colorectal cancer screening. Talk with your health care provider about your test results, treatment options, and if necessary, the need for more tests. Follow these instructions at home: Eating and drinking  Eat a diet that includes fresh fruits and vegetables, whole grains, lean protein, and low-fat dairy products. Take vitamin and mineral supplements as recommended by your health care provider. Do not drink alcohol if your health care provider tells you not to drink. If you drink alcohol: Limit how much you have to 0-2 drinks a day. Know how much alcohol is in your drink. In the [place] one drink equals one 12 oz bottle of beer (355 mL), one 5 oz glass of wine (148 mL), or one 1 oz glass of hard liquor (44 mL). Lifestyle Brush your teeth every morning and night with fluoride toothpaste. Floss one time each day. Exercise for at least 30 minutes 5 or more days each week. Do not use any products that contain nicotine or tobacco. These products include cigarettes, chewing tobacco, and vaping devices, such as e-cigarettes. If you need help quitting, ask your health care provider. Do not use drugs. If you are sexually active, practice safe sex. Use a condom or other form of protection to prevent STIs. Take aspirin only as told by your health care provider. Make sure that  you understand how much to take and what form to take. Work with your health care provider to find out whether it is safe and beneficial for you to take aspirin daily. Find healthy ways to manage stress, such as: Meditation, yoga, or  listening to music. Journaling. Talking to a trusted person. Spending time with friends and family. Minimize exposure to UV radiation to reduce your risk of skin cancer. Safety Always wear your seat belt while driving or riding in a vehicle. Do not drive: If you have been drinking alcohol. Do not ride with someone who has been drinking. When you are tired or distracted. While texting. If you have been using any mind-altering substances or drugs. Wear a helmet and other protective equipment during sports activities. If you have firearms in your house, make sure you follow all gun safety procedures. What's next? Go to your health care provider once a year for an annual wellness visit. Ask your health care provider how often you should have your eyes and teeth checked. Stay up to date on all vaccines. This information is not intended to replace advice given to you by your health care provider. Make sure you discuss any questions you have with your health care provider. Document Revised: 04/07/2021 Document Reviewed: 04/07/2021 Elsevier Patient Education  [employer].

## 2022-08-03 NOTE — Progress Notes (Signed)
Subjective:    Patient ID: Cody Castro, male    DOB: [birth year], 56 y.o.   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 2 cups of coffee a week. Eating healthy Married-son.   Pt up to date on colonoscopy. Also up to date on shingrix vaccine.   Up to date on flu vaccine.  Up to date on colonoscopy.     Review of Systems  Constitutional:  Negative for chills and fever.  HENT:  Negative for congestion and ear pain.   Respiratory:  Negative for cough, chest tightness, shortness of breath and wheezing.   Cardiovascular:  Negative for chest pain and palpitations.  Gastrointestinal:  Negative for abdominal pain, constipation, diarrhea and nausea.  Genitourinary:  Negative for difficulty urinating, enuresis, frequency and penile pain.  Musculoskeletal:  Negative for back pain and myalgias.       Knee feels better.  Neurological:  Negative for dizziness, syncope, weakness, numbness and headaches.  Hematological:  Negative for adenopathy. Does not bruise/bleed easily.  Psychiatric/Behavioral:  Negative for behavioral problems, confusion and dysphoric mood.    Past Medical History:  Diagnosis Date   Vitiligo      Social History   Socioeconomic History   Marital status: Married    Spouse name: Not on file   Number of children: Not on file   Years of education: Not on file   Highest education level: Not on file  Occupational History   Occupation: [employer]  Tobacco Use   Smoking status: Never   Smokeless tobacco: Never  Vaping Use   Vaping Use: Never used  Substance and Sexual Activity   Alcohol use: Yes    Alcohol/week: 14.0 standard drinks of alcohol    Types: 14 drink(s) per week   Drug use: No   Sexual activity: Yes    Partners: Female  Other Topics Concern   Not on file  Social History Narrative   Exercise--  Walking/ jogging   Social Determinants of Health   Financial Resource Strain: Not on file   Food Insecurity: Not on file  Transportation Needs: Not on file  Physical Activity: Not on file  Stress: Not on file  Social Connections: Not on file  Intimate Partner Violence: Not on file    No past surgical history on file.  Family History  Problem Relation Age of Onset   Hypertension Mother    Depression Mother    Depression Sister    Hypertension Sister    Heart disease Paternal Grandfather    Parkinsonism Paternal Grandfather    Alzheimer's disease Paternal Grandfather    Deep vein thrombosis Paternal Grandfather    Cancer Maternal Aunt 30       breast   Colon cancer Neg Hx    Esophageal cancer Neg Hx    Rectal cancer Neg Hx    Stomach cancer Neg Hx     Allergies  Allergen Reactions   Aspirin Other (See Comments)    Duodenal Ulcer @ age 76    Current Outpatient Medications on File Prior to Visit  Medication Sig Dispense Refill   diclofenac (VOLTAREN) 75 MG EC tablet Take 1 tablet (75 mg total) by mouth 2 (two) times daily. 20 tablet 0   famotidine (PEPCID) 20 MG tablet Take 1 tablet (20 mg total) by mouth 2 (two) times daily. 180 tablet 0   omeprazole (PRILOSEC) 40 MG capsule Take 1 capsule (40 mg  total) by mouth daily. 30 capsule 3   predniSONE (DELTASONE) 5 MG tablet Take 6 pills for first day, 5 pills second day, 4 pills third day, 3 pills fourth day, 2 pills the fifth day, and 1 pill sixth day. 21 tablet 0   sildenafil (VIAGRA) 100 MG tablet Take 0.5-1 tablets (50-100 mg total) by mouth daily as needed for erectile dysfunction. 10 tablet 3   Current Facility-Administered Medications on File Prior to Visit  Medication Dose Route Frequency Provider Last Rate Last Admin   alum & mag hydroxide-simeth (MAALOX/MYLANTA) 200-200-20 MG/5ML suspension 30 mL  30 mL Oral Once Xxavier Noon, PA-C       hyoscyamine (LEVSIN SL) SL tablet 0.125 mg  0.125 mg Oral Once Darcell Sabino, PA-C       lidocaine (XYLOCAINE) 2 % viscous mouth solution 15 mL  15 mL Mouth/Throat Once  Aiden Helzer, PA-C        BP 119/80   Pulse 63   Temp 98 F (36.7 C)   Resp 18   Ht 6' (1.829 m)   Wt 242 lb (109.8 kg)   SpO2 97%   BMI 32.82 kg/m     Objective:   Physical Exam  General Mental Status- Alert. General Appearance- Not in acute distress.   Skin General: Color- Normal Color. Moisture- Normal Moisture.  Neck Carotid Arteries- Normal color. Moisture- Normal Moisture. No carotid bruits. No JVD.  Chest and Lung Exam Auscultation: Breath Sounds:-Normal.  Cardiovascular Auscultation:Rythm- Regular. Murmurs & Other Heart Sounds:Auscultation of the heart reveals- No Murmurs.  Abdomen Inspection:-Inspeection Normal. Palpation/Percussion:Note:No mass. Palpation and Percussion of the abdomen reveal- Non Tender, Non Distended + BS, no rebound or guarding.  Neurologic Cranial Nerve exam:- CN III-XII intact(No nystagmus), symmetric smile. Strength:- 5/5 equal and symmetric strength both upper and lower extremities.       Assessment & Plan:   Patient Instructions  For you wellness exam today I have ordered cbc, cmp and lipid panel. Psa for screening as well.  Up to date on flu vaccine. Can get covid vaccine down stairs.  Recommend exercise and healthy diet.  We will let you know lab results as they come in.  Follow up date appointment will be determined after lab review.

## 2022-08-04 ENCOUNTER — Ambulatory Visit: Payer: No Typology Code available for payment source | Admitting: Family Medicine

## 2022-10-11 ENCOUNTER — Other Ambulatory Visit: Payer: Self-pay | Admitting: Medical

## 2022-10-11 MED ORDER — FAMOTIDINE 20 MG PO TABS: 20.0000 mg | ORAL_TABLET | Freq: Two times a day (BID) | ORAL | 1 refills | Status: DC

## 2023-02-06 ENCOUNTER — Encounter: Payer: Self-pay | Admitting: *Deleted

## 2023-04-03 ENCOUNTER — Encounter: Payer: Self-pay | Admitting: Medical

## 2023-04-03 MED ORDER — FAMOTIDINE 20 MG PO TABS: 20.0000 mg | ORAL_TABLET | Freq: Two times a day (BID) | ORAL | 1 refills | Status: DC

## 2023-04-04 NOTE — Addendum Note (Signed)
Addended by: Gwenevere Abbot on: 04/04/2023 10:40 PM   Modules accepted: Orders

## 2023-04-12 ENCOUNTER — Encounter: Payer: Self-pay | Admitting: Family Medicine

## 2023-04-12 ENCOUNTER — Other Ambulatory Visit: Payer: Self-pay

## 2023-04-12 ENCOUNTER — Ambulatory Visit (INDEPENDENT_AMBULATORY_CARE_PROVIDER_SITE_OTHER): Payer: No Typology Code available for payment source | Admitting: Family Medicine

## 2023-04-12 ENCOUNTER — Ambulatory Visit (INDEPENDENT_AMBULATORY_CARE_PROVIDER_SITE_OTHER): Payer: No Typology Code available for payment source

## 2023-04-12 VITALS — BP 112/84 | HR 61 | Ht 72.0 in | Wt 237.0 lb

## 2023-04-12 DIAGNOSIS — M25561 Pain in right knee: Secondary | ICD-10-CM | POA: Diagnosis not present

## 2023-04-12 DIAGNOSIS — M25562 Pain in left knee: Secondary | ICD-10-CM

## 2023-04-12 NOTE — Progress Notes (Signed)
I, Stevenson Clinch, CMA acting as a scribe for Clementeen Graham, MD.  Cody Castro is a 57 y.o. male who presents to [facility] at [facility] today for bilat knee pain, L>R. Pt was previously seen by Dr. 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 Pt locates pain to distal to patella. The area is sensitive to pressure. Gets some relief with massaging the thighs. Left knee is worse than right. Swelling present. Mechanical sx present. Sx started to worsen after having to climb up and down a ladder a bunch of times. Since then even walking will flare sx up. Notes occasional pain at the lateral aspect of both thighs. Hx of hyperextension injury to the left knee x 2.   Knee swelling: yes Mechanical symptoms: yes Aggravates:  Treatments tried: prednisone, ice, compression, OTC IBU/Acetaminophen  Pertinent review of systems: No fevers or chills  Relevant historical information: Vitiligo   Exam:  BP 112/84   Pulse 61   Ht 6' (1.829 m)   Wt 237 lb (107.5 kg)   SpO2 94%   BMI 32.14 kg/m  General: Well Developed, well nourished, and in no acute distress.   MSK: Knees bilaterally effusion. Normal motion with crepitation. Tender palpation medial joint line.     Lab and Radiology Results  Procedure: Real-time Ultrasound Guided Injection of right knee joint superior lateral patellar space Device: Philips Affiniti 50G Images permanently stored and available for review in PACS Verbal informed consent obtained.  Discussed risks and benefits of procedure. Warned about infection, bleeding, hyperglycemia damage to structures among others. Patient expresses understanding and agreement Time-out conducted.   Noted no overlying erythema, induration, or other signs of local infection.   Skin prepped in a sterile fashion.   Local anesthesia: Topical Ethyl chloride.   With sterile technique and under real time ultrasound guidance: 40 mg of  Kenalog and 2 mL Marcaine injected into knee joint. Fluid seen entering the joint capsule.   Completed without difficulty   Pain immediately resolved suggesting accurate placement of the medication.   Advised to call if fevers/chills, erythema, induration, drainage, or persistent bleeding.   Images permanently stored and available for review in the ultrasound unit.  Impression: Technically successful ultrasound guided injection.    Procedure: Real-time Ultrasound Guided Injection of left knee joint superior lateral patellar space Device: Philips Affiniti 50G Images permanently stored and available for review in PACS Verbal informed consent obtained.  Discussed risks and benefits of procedure. Warned about infection, bleeding, hyperglycemia damage to structures among others. Patient expresses understanding and agreement Time-out conducted.   Noted no overlying erythema, induration, or other signs of local infection.   Skin prepped in a sterile fashion.   Local anesthesia: Topical Ethyl chloride.   With sterile technique and under real time ultrasound guidance: 40 mg of Kenalog and 2 mL of Marcaine injected into knee joint. Fluid seen entering the joint capsule.   Completed without difficulty   Pain immediately resolved suggesting accurate placement of the medication.   Advised to call if fevers/chills, erythema, induration, drainage, or persistent bleeding.   Images permanently stored and available for review in the ultrasound unit.  Impression: Technically successful ultrasound guided injection.      No results found for this or any previous visit (from the past 72 hour(s)). DG Knee AP/LAT W/Sunrise Left  Result Date: 04/12/2023 CLINICAL DATA:  Pain EXAM: LEFT KNEE 3 VIEWS COMPARISON:  None Available. FINDINGS: No evidence  of fracture, dislocation, or joint effusion. No evidence of arthropathy or other focal bone abnormality. Soft tissues are unremarkable. IMPRESSION: Negative.  Electronically Signed   By: Gerome Sam III M.D.   On: 04/12/2023 13:26   DG Knee AP/LAT W/Sunrise Right  Result Date: 04/12/2023 CLINICAL DATA:  Pain EXAM: RIGHT KNEE 3 VIEWS COMPARISON:  None Available. FINDINGS: Mild enthesopathic changes off the superior patella. No other bony or soft tissue abnormalities are identified. IMPRESSION: Mild enthesopathic changes off the superior patella. Electronically Signed   By: Gerome Sam III M.D.   On: 04/12/2023 13:26   I, Clementeen Graham, personally (independently) visualized and performed the interpretation of the images attached in this note.    Assessment and Plan: 57 y.o. male with bilateral knee pain thought to be due to patellofemoral pain syndrome with some mild degenerative changes.  Plan for steroid injection today.  Recommend also Voltaren gel.  If not sufficient add physical therapy.   PDMP not reviewed this encounter. Orders Placed This Encounter  Procedures   [place] LIMITED JOINT SPACE STRUCTURES LOW BILAT(NO LINKED CHARGES)    Order Specific Question:   Reason for Exam (SYMPTOM  OR DIAGNOSIS REQUIRED)    Answer:   bilat knee pain    Order Specific Question:   Preferred imaging location?    Answer:   [facility] Knee AP/LAT W/Sunrise Left    Standing Status:   Future    Number of Occurrences:   1    Standing Expiration Date:   05/12/2023    Order Specific Question:   Reason for Exam (SYMPTOM  OR DIAGNOSIS REQUIRED)    Answer:   bilat knee pain    Order Specific Question:   Preferred imaging location?    Answer:   [facility]   DG Knee AP/LAT W/Sunrise Right    Standing Status:   Future    Number of Occurrences:   1    Standing Expiration Date:   05/12/2023    Order Specific Question:   Reason for Exam (SYMPTOM  OR DIAGNOSIS REQUIRED)    Answer:   bilat knee pain    Order Specific Question:   Preferred imaging location?    Answer:   [facility]   No orders of the defined types  were placed in this encounter.    Discussed warning signs or symptoms. Please see discharge instructions. Patient expresses understanding.   The above documentation has been reviewed and is accurate and complete Clementeen Graham, M.D.

## 2023-04-12 NOTE — Patient Instructions (Signed)
Thank you for coming in today.  You received an injection today. Seek immediate medical attention if the joint becomes red, extremely painful, or is oozing fluid.  Try Voltaren Gel  Please get an Xray today before you leave

## 2023-04-13 ENCOUNTER — Ambulatory Visit: Payer: No Typology Code available for payment source | Admitting: Family Medicine

## 2023-04-13 NOTE — Progress Notes (Signed)
Right knee x-ray shows a little bit of a bone spur from the kneecap.  No significant arthritis is visible.

## 2023-04-13 NOTE — Progress Notes (Signed)
Left knee x-ray looks okay to radiology

## 2023-05-29 ENCOUNTER — Encounter: Payer: Self-pay | Admitting: Family Medicine

## 2023-05-29 ENCOUNTER — Ambulatory Visit (INDEPENDENT_AMBULATORY_CARE_PROVIDER_SITE_OTHER): Payer: No Typology Code available for payment source | Admitting: Family Medicine

## 2023-05-29 VITALS — BP 138/82 | HR 62 | Ht 72.0 in | Wt 236.0 lb

## 2023-05-29 DIAGNOSIS — G8929 Other chronic pain: Secondary | ICD-10-CM

## 2023-05-29 DIAGNOSIS — M25562 Pain in left knee: Secondary | ICD-10-CM | POA: Diagnosis not present

## 2023-05-29 NOTE — Patient Instructions (Signed)
Thank you for coming in today.   You should hear from MRI scheduling within 1 week. If you do not hear please let me know.    Following the results I may refer you directly to a surgeon or authorize other injections.

## 2023-05-29 NOTE — Progress Notes (Signed)
   I, Stevenson Clinch, CMA acting as a scribe for Clementeen Graham, MD.  Cody Castro is a 57 y.o. male who presents to [facility] at [facility] today for worsening L knee pain. Pt was last seen by Dr. Denyse Amass on 04/12/23 and was given bilat knee steroid injections and advised to use Voltaren gel.  Today, pt reports worsening pain at medial aspect of the knee x 1 day, started while standing/walking. Swelling and mechanical sx present. Radiating pain into the lower leg. The will completely give out at times, sometimes after only 3-4 steps. Sx improve with he stands on the right leg and flexes the knee repetitively. Has tried ice and Voltaren. Using knee brace. Has concerns about walking down steps.  He notes locking and catching especially with knee flexion and extension.  Dx imaging: 04/12/23 R & L knee XR  Pertinent review of systems: No fevers or chills  Relevant historical information: Palpitations   Exam:  BP 138/82   Pulse 62   Ht 6' (1.829 m)   Wt 236 lb (107 kg)   SpO2 95%   BMI 32.01 kg/m  General: Well Developed, well nourished, and in no acute distress.   MSK: Left knee normal appearing. Tender palpation medial joint line. Normal knee motion. Positive medial McMurray's test. Stable ligamentous exam.    Lab and Radiology Results  EXAM: LEFT KNEE 3 VIEWS   COMPARISON:  None Available.   FINDINGS: No evidence of fracture, dislocation, or joint effusion. No evidence of arthropathy or other focal bone abnormality. Soft tissues are unremarkable.   IMPRESSION: Negative.     Electronically Signed   By: Gerome Sam III M.D.   On: 04/12/2023 13:26 I, Clementeen Graham, personally (independently) visualized and performed the interpretation of the images attached in this note.     Assessment and Plan: 57 y.o. male with chronic left knee pain with mechanical symptoms.  This is concerning for a medial meniscus tear.  Plan for MRI.  If surgery is a  possibility we will refer directly to orthopedic surgery.   PDMP not reviewed this encounter. Orders Placed This Encounter  Procedures   MR Knee Left  Wo Contrast    Standing Status:   Future    Standing Expiration Date:   05/28/2024    Order Specific Question:   What is the patient's sedation requirement?    Answer:   No Sedation    Order Specific Question:   Does the patient have a pacemaker or implanted devices?    Answer:   No    Order Specific Question:   Preferred imaging location?    Answer:   [facility] (table limit-350lbs)   No orders of the defined types were placed in this encounter.    Discussed warning signs or symptoms. Please see discharge instructions. Patient expresses understanding.   The above documentation has been reviewed and is accurate and complete Clementeen Graham, M.D.

## 2023-06-23 ENCOUNTER — Ambulatory Visit (HOSPITAL_BASED_OUTPATIENT_CLINIC_OR_DEPARTMENT_OTHER)
Admission: RE | Admit: 2023-06-23 | Discharge: 2023-06-23 | Disposition: A | Discharge status: Home / Self Care | Payer: No Typology Code available for payment source | Source: Ambulatory Visit | Attending: Medical | Admitting: Medical

## 2023-06-23 ENCOUNTER — Ambulatory Visit (INDEPENDENT_AMBULATORY_CARE_PROVIDER_SITE_OTHER): Payer: No Typology Code available for payment source | Admitting: Medical

## 2023-06-23 VITALS — BP 132/80 | HR 98 | Temp 99.2°F | Resp 20 | Ht 72.0 in | Wt 233.0 lb

## 2023-06-23 DIAGNOSIS — R509 Fever, unspecified: Secondary | ICD-10-CM

## 2023-06-23 DIAGNOSIS — J3489 Other specified disorders of nose and nasal sinuses: Secondary | ICD-10-CM

## 2023-06-23 DIAGNOSIS — J40 Bronchitis, not specified as acute or chronic: Secondary | ICD-10-CM | POA: Diagnosis not present

## 2023-06-23 DIAGNOSIS — M791 Myalgia, unspecified site: Secondary | ICD-10-CM

## 2023-06-23 DIAGNOSIS — R059 Cough, unspecified: Secondary | ICD-10-CM | POA: Insufficient documentation

## 2023-06-23 LAB — CBC WITH DIFFERENTIAL/PLATELET
Basophils Absolute: 0 10*3/uL (ref 0.0–0.1)
Basophils Relative: 0.2 % (ref 0.0–3.0)
Eosinophils Absolute: 0 10*3/uL (ref 0.0–0.7)
Eosinophils Relative: 0.7 % (ref 0.0–5.0)
HCT: 46.3 % (ref 39.0–52.0)
Hemoglobin: 15.3 g/dL (ref 13.0–17.0)
Lymphocytes Relative: 9.9 % — ABNORMAL LOW (ref 12.0–46.0)
Lymphs Abs: 0.7 10*3/uL (ref 0.7–4.0)
MCHC: 33.1 g/dL (ref 30.0–36.0)
MCV: 100.3 fl — ABNORMAL HIGH (ref 78.0–100.0)
Monocytes Absolute: 0.8 10*3/uL (ref 0.1–1.0)
Monocytes Relative: 10.4 % (ref 3.0–12.0)
Neutro Abs: 5.7 10*3/uL (ref 1.4–7.7)
Neutrophils Relative %: 78.8 % — ABNORMAL HIGH (ref 43.0–77.0)
Platelets: 247 10*3/uL (ref 150.0–400.0)
RBC: 4.61 Mil/uL (ref 4.22–5.81)
RDW: 13.2 % (ref 11.5–15.5)
WBC: 7.3 10*3/uL (ref 4.0–10.5)

## 2023-06-23 LAB — POCT INFLUENZA A/B
Influenza A, POC: NEGATIVE
Influenza B, POC: NEGATIVE

## 2023-06-23 MED ORDER — AMOXICILLIN-POT CLAVULANATE 875-125 MG PO TABS: 1.0000 | ORAL_TABLET | Freq: Two times a day (BID) | ORAL | 0 refills | Status: DC

## 2023-06-23 MED ORDER — BENZONATATE 100 MG PO CAPS: 100.0000 mg | ORAL_CAPSULE | Freq: Three times a day (TID) | ORAL | 0 refills | Status: DC | PRN

## 2023-06-23 MED ORDER — FLUTICASONE PROPIONATE 50 MCG/ACT NA SUSP: 2.0000 | Freq: Every day | NASAL | 1 refills | Status: DC

## 2023-06-23 MED ORDER — AZITHROMYCIN 250 MG PO TABS
ORAL_TABLET | ORAL | 0 refills | Status: AC
Start: 2023-06-23 — End: 2023-06-28

## 2023-06-23 NOTE — Addendum Note (Signed)
Addended by: Gwenevere Abbot on: 06/23/2023 02:39 PM   Modules accepted: Orders

## 2023-06-23 NOTE — Progress Notes (Signed)
   Subjective:    Patient ID: Cody Castro, male    DOB: 1966-06-15, 57 y.o.   MRN: [id number]  HPI  Discussed the use of AI scribe software for clinical note transcription with the patient, who gave verbal consent to proceed.  History of Present Illness   The patient, with a history of deviated septum and occasional sinus infections, has been ill since Sunday. The illness began with sinus pain, which then progressed to a sore throat and subsequently descended into the chest. The patient describes a persistent cough, occurring approximately every ten minutes, which produces no expectoration despite a sensation of something needing to be expelled. Post-cough, he reports a gurgling sound in the chest. Accompanying symptoms include constant headache, described as brand around the head, and body aches.  The patient denies any shortness of breath or wheezing. He has taken two COVID tests, both of which returned negative results. The patient has not received a flu vaccine. He reports difficulty sleeping due to the cough and headache. He also describes a sensation of full sinuses and frequent 'squeaking' sounds from sinus.  The patient has been managing symptoms with a nasal spray, Tylenol, Nyquil, Mucinex, ibuprofen, and Alka Seltzer. He reports some stiffness and soreness on the left side of the neck, but deny any rigidity. There is no reported nausea or vomiting, and the patient is a non-smoker.       Review of Systems See hpi.    Objective:   Physical Exam General- No acute distress. Pleasant patient. Neck- Full range of motion, no jvd. No tracheal deviation. Lungs- Clear, even and unlabored. But left lower lobe rough breath sonds/crackles.  Heart- regular rate and rhythm. Neurologic- CNII- XII grossly intact.  Heent- fontal sinus pressure on palption. Rt tm mild dull red.        Assessment & Plan:   Assessment and Plan     Patient Instructions  Possible Sinusitis and concern left ear  infection Symptoms started with sinus pain, then sore throat, and progressed to chest discomfort. History of sinus infections due to deviated septum. Exam shows tender sinuses and possible early ear infection. -Start Augmentin for suspected sinus infection.  Possible Pneumonia vs bronchitis Chest congestion with crackles heard in left lower lung on exam. No shortness of breath or wheezing reported. -Order CBC and chest x-ray. -If pneumonia is confirmed on chest x-ray, add Azithromycin to antibiotic regimen.  Cough Dry, non-productive cough causing sleep disturbance. -Prescribe Benzonatate for cough relief.  General Health Maintenance / Follow up Plans -Advise rest and possible work absence on monday and tuesday  if pneumonia is confirmed. -Follow up based on lab and chest x-ray results. -Encourage patient to update via MyChart with any questions or concerns.   Esperanza Richters, PA-C

## 2023-06-23 NOTE — Patient Instructions (Signed)
Possible Sinusitis and concern left ear infection Symptoms started with sinus pain, then sore throat, and progressed to chest discomfort. History of sinus infections due to deviated septum. Exam shows tender sinuses and possible early ear infection. -Start Augmentin for suspected sinus infection.  Possible Pneumonia vs bronchitis Chest congestion with crackles heard in left lower lung on exam. No shortness of breath or wheezing reported. -Order CBC and chest x-ray. -If pneumonia is confirmed on chest x-ray, add Azithromycin to antibiotic regimen.  Cough Dry, non-productive cough causing sleep disturbance. -Prescribe Benzonatate for cough relief.  General Health Maintenance / Follow up Plans -Advise rest and possible work absence on monday and tuesday  if pneumonia is confirmed. -Follow up based on lab and chest x-ray results. -Encourage patient to update via MyChart with any questions or concerns.

## 2023-06-24 ENCOUNTER — Other Ambulatory Visit: Payer: No Typology Code available for payment source

## 2023-07-05 ENCOUNTER — Other Ambulatory Visit: Payer: Self-pay | Admitting: Medical

## 2023-07-05 MED ORDER — BENZONATATE 100 MG PO CAPS: 100.0000 mg | ORAL_CAPSULE | Freq: Three times a day (TID) | ORAL | 0 refills | Status: DC | PRN

## 2023-07-22 ENCOUNTER — Ambulatory Visit: Payer: No Typology Code available for payment source

## 2023-07-22 DIAGNOSIS — M25562 Pain in left knee: Secondary | ICD-10-CM

## 2023-07-22 DIAGNOSIS — G8929 Other chronic pain: Secondary | ICD-10-CM

## 2023-08-07 NOTE — Progress Notes (Signed)
Knee MRI shows bone bruising and arthritis.  No meniscus or ligament injury.  Recommend return to clinic and consider steroid injection.

## 2023-08-17 NOTE — Progress Notes (Signed)
I, Stevenson Clinch, CMA acting as a scribe for Clementeen Graham, MD.  Cody Castro is a 57 y.o. male who presents to [facility] at [facility] today for f/u L knee pain w/ MRI review. Pt was last seen by Dr. Denyse Amass on 05/29/23 and a MRI was ordered.   Today, pt reports no change in sx since last visit. Notes continued pain at medial aspect of the knee. Intermittent swelling, radiating pain into the lower leg, and mechanical sx. MRI review today.   Dx imaging: 07/22/23 L knee MRI 04/12/23 R & L knee XR   Pertinent review of systems: No fevers or chills  Relevant historical information: Works as a Investment banker, operational at [employer]   Exam:  BP 138/86   Pulse 65   Wt 237 lb (107.5 kg)   SpO2 98%   BMI 32.14 kg/m  General: Well Developed, well nourished, and in no acute distress.   MSK: Left knee normal-appearing normal motion with crepitation.  Tender palpation medial joint line. Some laxity MCL stress test. Intact strength.    Lab and Radiology Results   EXAM: MRI OF THE LEFT KNEE WITHOUT CONTRAST   TECHNIQUE: Multiplanar, multisequence MR imaging of the knee was performed. No intravenous contrast was administered.   COMPARISON:  None Available.   FINDINGS: MENISCI   Medial: Intact.   Lateral: Intact.   LIGAMENTS   Cruciates: ACL and PCL are intact.   Collaterals: Medial collateral ligament is intact. Lateral collateral ligament complex is intact.   CARTILAGE   Patellofemoral: Partial-thickness cartilage loss of the patellofemoral compartment.   Medial: High-grade partial-thickness cartilage loss of the medial femorotibial compartment with areas of full-thickness cartilage loss and subchondral reactive marrow edema.   Lateral: Mild partial-thickness cartilage loss of the lateral femorotibial compartment.   JOINT: Small joint effusion. Normal Hoffa's fat-pad. No plical thickening.   POPLITEAL FOSSA: Popliteus tendon is intact. Small Baker's  cyst.   EXTENSOR MECHANISM: Intact quadriceps tendon. Intact patellar tendon. Intact lateral patellar retinaculum. Intact medial patellar retinaculum. Intact MPFL.   BONES: No aggressive osseous lesion. Severe bone marrow edema in the anterior mid proximal tibia just anterior to the ACL insertion concerning for an osseous contusion. No acute fracture or dislocation.   Other: No fluid collection or hematoma. Muscles are normal.   IMPRESSION: 1. Severe bone marrow edema in the anterior mid proximal tibia just anterior to the ACL insertion concerning for an osseous contusion. 2. Tricompartmental cartilage abnormalities as described above, most severe in the medial femorotibial compartment. 3. No meniscal or ligamentous injury of the left knee.     Electronically Signed   By: Elige Ko M.D.   On: 08/07/2023 05:50 I, Clementeen Graham, personally (independently) visualized and performed the interpretation of the images attached in this note.     Assessment and Plan: 57 y.o. male with left knee pain.  This has been ongoing for months.  He had a trial of steroid injection which did not help very much.  MRI shows areas of cartilage loss and bone bruising especially at the medial compartment.  I am not optimistic that further steroid injections will help.  We talked about options.  Plan for hyaluronic acid injections as well as a medial off loader knee brace.  His DJD and relative instability will be helped by the knee brace.  He should hear soon about scheduling the gel shots.   PDMP not reviewed this encounter. No orders of the defined types were placed  in this encounter.  No orders of the defined types were placed in this encounter.    Discussed warning signs or symptoms. Please see discharge instructions. Patient expresses understanding.   The above documentation has been reviewed and is accurate and complete Clementeen Graham, M.D.

## 2023-08-18 ENCOUNTER — Ambulatory Visit (INDEPENDENT_AMBULATORY_CARE_PROVIDER_SITE_OTHER): Payer: No Typology Code available for payment source | Admitting: Family Medicine

## 2023-08-18 ENCOUNTER — Encounter: Payer: Self-pay | Admitting: Family Medicine

## 2023-08-18 VITALS — BP 138/86 | HR 65 | Wt 237.0 lb

## 2023-08-18 DIAGNOSIS — M25562 Pain in left knee: Secondary | ICD-10-CM

## 2023-08-18 DIAGNOSIS — G8929 Other chronic pain: Secondary | ICD-10-CM | POA: Diagnosis not present

## 2023-08-18 DIAGNOSIS — M1712 Unilateral primary osteoarthritis, left knee: Secondary | ICD-10-CM | POA: Diagnosis not present

## 2023-08-21 ENCOUNTER — Telehealth: Payer: Self-pay

## 2023-08-21 NOTE — Telephone Encounter (Signed)
-----   Message from [facility] sent at 08/18/2023  9:29 AM EDT ----- Regarding: visco Auth visco for left knee (can auth as bilat).

## 2023-08-21 NOTE — Telephone Encounter (Signed)
 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

## 2023-08-23 NOTE — Telephone Encounter (Addendum)
Prior Auth REQUIRED for ORTHOVISC for BILAT knee OA.

## 2023-08-24 NOTE — Telephone Encounter (Signed)
Prior Authorization initiated for ORTHOVISC via [email or web address] KEY: [id number]

## 2023-08-28 NOTE — Telephone Encounter (Signed)
Prior Authorization initiated for [facility] via [email or web address] KEY: [id number]

## 2023-08-30 MED ORDER — HYALURONAN 30 MG/2ML IX SOSY: 30.0000 mg | PREFILLED_SYRINGE | INTRA_ARTICULAR | 0 refills | Status: DC

## 2023-08-30 NOTE — Telephone Encounter (Signed)
Called pt and left VM for pt to call the office.   Rx for Orthovisc sent to [facility]

## 2023-08-30 NOTE — Telephone Encounter (Signed)
Prior Auth APPROVED via [email or web address] for Orthovisc  PA# [phone] Valid: 08/29/23-08/28/24 6 units

## 2023-08-30 NOTE — Addendum Note (Signed)
Addended by: Dierdre Searles on: 08/30/2023 09:22 AM   Modules accepted: Orders

## 2023-09-18 NOTE — Telephone Encounter (Signed)
Called [facility] and provided prior auth information. They advised to allow 1-2 business days, they will contact the pt about shipping.

## 2023-09-28 NOTE — Telephone Encounter (Signed)
Prior auth re-submitted throught CoverMyMeds with [facility] KEY: [id number]

## 2023-09-28 NOTE — Telephone Encounter (Signed)
Can you send me the medical benefits for his insurance?

## 2023-09-28 NOTE — Telephone Encounter (Signed)
Spoke to patient. He said that they still did not have authorization on file. He asked if there was another pharmacy that he could use?  Appointment scheduled for his right knee.

## 2023-10-02 NOTE — Telephone Encounter (Signed)
ORTHOVISC for BILAT knee OA   Primary Insurance: [employer] [place] [facility] Co-pay: n/a Co-insurance: 20% Deductible: $2,250 of $2,250 met Prior Auth: APPROVED PA# [id number] Valid: 08/29/23-08/28/24 6 units   Knee Injection History

## 2023-10-03 NOTE — Telephone Encounter (Signed)
Scheduled

## 2023-10-05 ENCOUNTER — Ambulatory Visit: Payer: No Typology Code available for payment source | Admitting: Family Medicine

## 2023-10-05 ENCOUNTER — Other Ambulatory Visit: Payer: Self-pay

## 2023-10-05 VITALS — BP 130/82 | HR 66 | Ht 72.0 in | Wt 242.0 lb

## 2023-10-05 DIAGNOSIS — M25562 Pain in left knee: Secondary | ICD-10-CM

## 2023-10-05 DIAGNOSIS — M25561 Pain in right knee: Secondary | ICD-10-CM | POA: Diagnosis not present

## 2023-10-05 DIAGNOSIS — G8929 Other chronic pain: Secondary | ICD-10-CM

## 2023-10-05 DIAGNOSIS — M170 Bilateral primary osteoarthritis of knee: Secondary | ICD-10-CM

## 2023-10-05 MED ORDER — HYALURONAN 30 MG/2ML IX SOSY
30.0000 mg | PREFILLED_SYRINGE | Freq: Once | INTRA_ARTICULAR | Status: AC
Start: 2023-10-05 — End: 2023-10-05
  Administered 2023-10-05: 30 mg via INTRA_ARTICULAR

## 2023-10-05 NOTE — Patient Instructions (Signed)
Thank you for coming in today.   You received an injection today. Seek immediate medical attention if the joint becomes red, extremely painful, or is oozing fluid.   Schedule the 2nd Orthovisc injection for next week and the 3rd for the week after Christmas

## 2023-10-05 NOTE — Progress Notes (Signed)
I, Stevenson Clinch, CMA acting as a scribe for Clementeen Graham, MD.  Cody Castro is a 57 y.o. male who presents to [facility] at [facility] today for f/u L knee pain. Pt was last seen by Dr. Denyse Amass on 08/18/23 and was advised to wear a medial off-loader knee brace and we worked to authorized hyaluronic acid injections.  Today, pt reports continued knee pain, R>L. Right knee causing night disturbance. Mechanical sx present. Intermittent swelling. Bilateral Orthovisc injections today.  His right knee is starting to hurt a little more as he has been favoring his left knee.  Dx imaging: 07/22/23 L knee MRI 04/12/23 R & L knee XR   Pertinent review of systems: No fevers or chills  Relevant historical information: Vitiligo   Exam:  BP 130/82   Pulse 66   Ht 6' (1.829 m)   Wt 242 lb (109.8 kg)   SpO2 99%   BMI 32.82 kg/m  General: Well Developed, well nourished, and in no acute distress.   MSK: Knees bilaterally minimal effusion normal motion.    Lab and Radiology Results  Cody Castro presents to clinic today for Orthovisc injection bilateral knee 1/3 Procedure: Real-time Ultrasound Guided Injection of right knee joint superior lateral patellar space Device: Philips Affiniti 50G/GE Logiq Images permanently stored and available for review in PACS Verbal informed consent obtained.  Discussed risks and benefits of procedure. Warned about infection, bleeding, damage to structures among others. Patient expresses understanding and agreement Time-out conducted.   Noted no overlying erythema, induration, or other signs of local infection.   Skin prepped in a sterile fashion.   Local anesthesia: Topical Ethyl chloride.   With sterile technique and under real time ultrasound guidance: Orthovisc 30 mg injected into knee joint. Fluid seen entering the joint capsule.   Completed without difficulty   Advised to call if fevers/chills, erythema, induration, drainage, or persistent  bleeding.   Images permanently stored and available for review in the ultrasound unit.  Impression: Technically successful ultrasound guided injection.  Procedure: Real-time Ultrasound Guided Injection of left knee joint superior lateral patellar space Device: Philips Affiniti 50G/GE Logiq Images permanently stored and available for review in PACS Verbal informed consent obtained.  Discussed risks and benefits of procedure. Warned about infection, bleeding, damage to structures among others. Patient expresses understanding and agreement Time-out conducted.   Noted no overlying erythema, induration, or other signs of local infection.   Skin prepped in a sterile fashion.   Local anesthesia: Topical Ethyl chloride.   With sterile technique and under real time ultrasound guidance: Orthovisc 30 mg injected into knee joint. Fluid seen entering the joint capsule.   Completed without difficulty   Advised to call if fevers/chills, erythema, induration, drainage, or persistent bleeding.   Images permanently stored and available for review in the ultrasound unit.  Impression: Technically successful ultrasound guided injection. Lot number: [id number] for both injections      Assessment and Plan: 57 y.o. male with chronic bilateral knee pain left worse than right due to DJD.  Plan for Orthovisc series today.  Return in 1 week for Orthovisc injection 2/3 and on December 30 31st for injection #3.   PDMP not reviewed this encounter. Orders Placed This Encounter  Procedures   [place] LIMITED JOINT SPACE STRUCTURES LOW BILAT(NO LINKED CHARGES)    Reason for Exam (SYMPTOM  OR DIAGNOSIS REQUIRED):   bilat knee pain    Preferred imaging location?:   [facility]  Meds ordered this encounter  Medications   Hyaluronan (ORTHOVISC) intra-articular injection 30 mg   Hyaluronan (ORTHOVISC) intra-articular injection 30 mg     Discussed warning signs or symptoms. Please see discharge  instructions. Patient expresses understanding.   The above documentation has been reviewed and is accurate and complete Clementeen Graham, M.D.

## 2023-10-11 ENCOUNTER — Ambulatory Visit (INDEPENDENT_AMBULATORY_CARE_PROVIDER_SITE_OTHER): Payer: No Typology Code available for payment source | Admitting: Medical

## 2023-10-11 ENCOUNTER — Ambulatory Visit (HOSPITAL_BASED_OUTPATIENT_CLINIC_OR_DEPARTMENT_OTHER)
Admission: RE | Admit: 2023-10-11 | Discharge: 2023-10-11 | Disposition: A | Discharge status: Home / Self Care | Payer: No Typology Code available for payment source | Source: Ambulatory Visit | Attending: Medical | Admitting: Medical

## 2023-10-11 VITALS — BP 122/84 | HR 64 | Resp 18 | Ht 72.0 in | Wt 240.0 lb

## 2023-10-11 DIAGNOSIS — Z23 Encounter for immunization: Secondary | ICD-10-CM

## 2023-10-11 DIAGNOSIS — R5383 Other fatigue: Secondary | ICD-10-CM | POA: Diagnosis not present

## 2023-10-11 DIAGNOSIS — R0781 Pleurodynia: Secondary | ICD-10-CM | POA: Insufficient documentation

## 2023-10-11 LAB — CBC WITH DIFFERENTIAL/PLATELET
Basophils Absolute: 0 10*3/uL (ref 0.0–0.1)
Basophils Relative: 0.2 % (ref 0.0–3.0)
Eosinophils Absolute: 0.2 10*3/uL (ref 0.0–0.7)
Eosinophils Relative: 3 % (ref 0.0–5.0)
HCT: 44.8 % (ref 39.0–52.0)
Hemoglobin: 14.9 g/dL (ref 13.0–17.0)
Lymphocytes Relative: 38 % (ref 12.0–46.0)
Lymphs Abs: 2.2 10*3/uL (ref 0.7–4.0)
MCHC: 33.4 g/dL (ref 30.0–36.0)
MCV: 99.5 fL (ref 78.0–100.0)
Monocytes Absolute: 0.4 10*3/uL (ref 0.1–1.0)
Monocytes Relative: 7.8 % (ref 3.0–12.0)
Neutro Abs: 2.9 10*3/uL (ref 1.4–7.7)
Neutrophils Relative %: 51 % (ref 43.0–77.0)
Platelets: 263 10*3/uL (ref 150.0–400.0)
RBC: 4.5 Mil/uL (ref 4.22–5.81)
RDW: 13.6 % (ref 11.5–15.5)
WBC: 5.7 10*3/uL (ref 4.0–10.5)

## 2023-10-11 MED ORDER — DICLOFENAC SODIUM 75 MG PO TBEC: 75.0000 mg | DELAYED_RELEASE_TABLET | Freq: Two times a day (BID) | ORAL | 0 refills | Status: DC

## 2023-10-11 NOTE — Addendum Note (Signed)
Addended by: Gwenevere Abbot on: 10/11/2023 05:11 PM   Modules accepted: Orders

## 2023-10-11 NOTE — Patient Instructions (Addendum)
Left-sided Rib Pain and pleuritic pain.(Considering muscle pain, pleutitic pain vs infection). Less likely shingles. Acute onset, sharp, stabbing pain in the left lower rib area, exacerbated by deep breathing and certain movements. No known injury or trauma. Pain is severe and disturbing sleep. No associated rash, fever, or systemic symptoms. Ibuprofen and topical analgesics provided minimal relief. -Order chest and rib X-ray to rule out pneumonia or rib pathology. -Order blood work to assess for infection. -Advise patient to continue using [employer] as it provided some relief. -Consider prescribing a stronger anti-inflammatory if X-ray results are normal. -Instruct patient to monitor for any skin changes, particularly small blisters, and report immediately. -Plan to update patient with X-ray and blood work results via [employer]. -if pain become lt upper quadrant let me know as well.

## 2023-10-11 NOTE — Progress Notes (Addendum)
   Subjective:    Patient ID: Cody Castro, male    DOB: 01/24/1966, 57 y.o.   MRN: [id number]  HPI  The patient presented with a one-week history of left-sided rib pain, described as a sharp, stabbing sensation. The pain was initially located towards the back of the ribs, but has since migrated to the front, causing discomfort during ambulation. The patient denied any precipitating injury or fall. The pain was exacerbated by deep breathing and certain movements, particularly side-to-side bending. The patient reported difficulty finding a comfortable sleeping position due to the persistent pain, leading to disrupted sleep and subsequent fatigue.  The patient attempted self-management with [facility] and ibuprofen, but reported minimal relief from these interventions. They also noted a minor chest congestion and a need to cough, particularly the night before the consultation. The patient denied any associated nausea, vomiting, fever, chills, or sweats. They also reported no visible skin changes, such as a rash.  The patient has a history of receiving the shingles vaccine, but understood that this does not preclude the possibility of developing the condition. They were advised to monitor for any skin changes, particularly small blisters, and to report any changes in the features of their pain.  Review of Systems See hpi    Objective:   Physical Exam  General Mental Status- Alert. General Appearance- Not in acute distress.   Skin No vesicular eruption or rash on left lower rib area.  Neck Carotid Arteries- Normal color. Moisture- Normal Moisture. No carotid bruits. No JVD.  Chest and Lung Exam Auscultation: Breath Sounds:-Normal.  Cardiovascular Auscultation:Rythm- Regular. Murmurs & Other Heart Sounds:Auscultation of the heart reveals- No Murmurs.  Abdomen Inspection:-Inspeection Normal. Palpation/Percussion:Note:No mass. Palpation and Percussion of the abdomen reveal- Non Tender, Non  Distended + BS, no rebound or guarding.    Neurologic Cranial Nerve exam:- CN III-XII intact(No nystagmus), symmetric smile. Strength:- 5/5 equal and symmetric strength both upper and lower extremities.   Left rib region tenderness to palpation in the lower rib mid axillary area and anteriorly as well.  No warmth or color changes skin.  On deep inspiration reports some pain.  Also pain on lateral movement.    Assessment & Plan:   Left-sided Rib Pain and pleuritic pain. Acute onset, sharp, stabbing pain in the left lower rib area, exacerbated by deep breathing and certain movements. No known injury or trauma. Pain is severe and disturbing sleep. No associated rash, fever, or systemic symptoms. Ibuprofen and topical analgesics provided minimal relief. -Order chest and rib X-ray to rule out pneumonia or rib pathology. -Order blood work to assess for infection. -Advise patient to continue using [employer] as it provided some relief. -Consider prescribing a stronger anti-inflammatory if X-ray results are normal. -Instruct patient to monitor for any skin changes, particularly small blisters, and report immediately. -Plan to update patient with X-ray and blood work results via [employer]. -if pain become lt upper quadrant let me know as well.  Esperanza Richters, PA-C

## 2023-10-12 ENCOUNTER — Other Ambulatory Visit: Payer: Self-pay

## 2023-10-12 ENCOUNTER — Ambulatory Visit: Payer: No Typology Code available for payment source | Admitting: Family Medicine

## 2023-10-12 DIAGNOSIS — M170 Bilateral primary osteoarthritis of knee: Secondary | ICD-10-CM

## 2023-10-12 MED ORDER — HYALURONAN 30 MG/2ML IX SOSY
30.0000 mg | PREFILLED_SYRINGE | Freq: Once | INTRA_ARTICULAR | Status: AC
  Administered 2023-10-12: 30 mg via INTRA_ARTICULAR

## 2023-10-12 NOTE — Telephone Encounter (Addendum)
Orthovisc injection schedule:  10/05/23 - BILAT 10/12/23 - BILAT 10/23/23 - BILAT   Can consider repeat injections on or after 04/23/24

## 2023-10-12 NOTE — Progress Notes (Signed)
Armandina Gemma presents to clinic today for Orthovisc injection bilateral knee 2/3 Procedure: Real-time Ultrasound Guided Injection of right knee joint superior lateral patellar space Device: Philips Affiniti 50G/GE Logiq Images permanently stored and available for review in PACS Verbal informed consent obtained.  Discussed risks and benefits of procedure. Warned about infection, bleeding, damage to structures among others. Patient expresses understanding and agreement Time-out conducted.   Noted no overlying erythema, induration, or other signs of local infection.   Skin prepped in a sterile fashion.   Local anesthesia: Topical Ethyl chloride.   With sterile technique and under real time ultrasound guidance: Orthovisc 30 mg injected into knee joint. Fluid seen entering the joint capsule.   Completed without difficulty   Advised to call if fevers/chills, erythema, induration, drainage, or persistent bleeding.   Images permanently stored and available for review in the ultrasound unit.  Impression: Technically successful ultrasound guided injection.  Procedure: Real-time Ultrasound Guided Injection of left knee joint superior lateral patellar space Device: [employer]ips Affiniti 50G/GE Logiq Images permanently stored and available for review in PACS Verbal informed consent obtained.  Discussed risks and benefits of procedure. Warned about infection, bleeding, damage to structures among others. Patient expresses understanding and agreement Time-out conducted.   Noted no overlying erythema, induration, or other signs of local infection.   Skin prepped in a sterile fashion.   Local anesthesia: Topical Ethyl chloride.   With sterile technique and under real time ultrasound guidance: Orthovisc 30 mg injected into knee joint. Fluid seen entering the joint capsule.   Completed without difficulty   Advised to call if fevers/chills, erythema, induration, drainage, or persistent bleeding.   Images permanently  stored and available for review in the ultrasound unit.  Impression: Technically successful ultrasound guided injection. Lot number: [id number] for both Return as scheduled on December 30 for Orthovisc injection bilateral knees 3/3

## 2023-10-23 ENCOUNTER — Other Ambulatory Visit: Payer: Self-pay

## 2023-10-23 ENCOUNTER — Ambulatory Visit (INDEPENDENT_AMBULATORY_CARE_PROVIDER_SITE_OTHER): Payer: No Typology Code available for payment source | Admitting: Family Medicine

## 2023-10-23 DIAGNOSIS — M170 Bilateral primary osteoarthritis of knee: Secondary | ICD-10-CM | POA: Diagnosis not present

## 2023-10-23 MED ORDER — HYALURONAN 30 MG/2ML IX SOSY
30.0000 mg | PREFILLED_SYRINGE | Freq: Once | INTRA_ARTICULAR | Status: AC
  Administered 2023-10-23: 30 mg via INTRA_ARTICULAR

## 2023-10-23 NOTE — Progress Notes (Signed)
Cody Castro presents to clinic today for Orthovisc injection bilateral knee 3/3 Procedure: Real-time Ultrasound Guided Injection of right knee joint superior lateral patellar space Device: [employer] Affiniti 50G/GE Logiq Images permanently stored and available for review in PACS Verbal informed consent obtained.  Discussed risks and benefits of procedure. Warned about infection, bleeding, damage to structures among others. Patient expresses understanding and agreement Time-out conducted.   Noted no overlying erythema, induration, or other signs of local infection.   Skin prepped in a sterile fashion.   Local anesthesia: Topical Ethyl chloride.   With sterile technique and under real time ultrasound guidance: Orthovisc 30 mg injected into knee joint. Fluid seen entering the joint capsule.   Completed without difficulty   Advised to call if fevers/chills, erythema, induration, drainage, or persistent bleeding.   Images permanently stored and available for review in the ultrasound unit.  Impression: Technically successful ultrasound guided injection.  Procedure: Real-time Ultrasound Guided Injection of left knee joint superior lateral patellar space Device: [employer] Affiniti 50G/GE Logiq Images permanently stored and available for review in PACS Verbal informed consent obtained.  Discussed risks and benefits of procedure. Warned about infection, bleeding, damage to structures among others. Patient expresses understanding and agreement Time-out conducted.   Noted no overlying erythema, induration, or other signs of local infection.   Skin prepped in a sterile fashion.   Local anesthesia: Topical Ethyl chloride.   With sterile technique and under real time ultrasound guidance: Orthovisc 30 mg injected into knee joint. Fluid seen entering the joint capsule.   Completed without difficulty   Advised to call if fevers/chills, erythema, induration, drainage, or persistent bleeding.   Images permanently  stored and available for review in the ultrasound unit.  Impression: Technically successful ultrasound guided injection. Lot number: [id number] for both injections  Recheck as needed

## 2023-10-23 NOTE — Patient Instructions (Signed)
Thank you for coming in today.   You received an injection today. Seek immediate medical attention if the joint becomes red, extremely painful, or is oozing fluid.  

## 2023-10-25 ENCOUNTER — Encounter: Payer: Self-pay | Admitting: Medical

## 2024-03-22 NOTE — Progress Notes (Signed)
 Joanna Muck, PhD, LAT, ATC acting as a scribe for Garlan Juniper, MD.  Cody Castro is a 58 y.o. male who presents to [facility] at [facility] today for exacerbation of his L knee pain. Pt was last seen by Dr. Alease Hunter on 10/23/23 and completed the Orthovisc series, bilaterally.  Today, pt reports L knee pain returned around March. Progressively worsening. R knee pain is not as severe. He feels like he is compensating, which is causing over pains throughout his body.  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  Dx imaging: 07/22/23 L knee MRI 04/12/23 R & L knee XR  Pertinent review of systems: No fevers or chills  Relevant historical information: Vitiligo.   Exam:  BP 132/86   Pulse 64   Ht 6' (1.829 m)   Wt 244 lb (110.7 kg)   SpO2 96%   BMI 33.09 kg/m  General: Well Developed, well nourished, and in no acute distress.   MSK: Left knee mild effusion normal-appearing otherwise normal motion. Right knee minimal effusion normal motion.    Lab and Radiology Results  Procedure: Real-time Ultrasound Guided Injection of left knee joint superior lateral patella space Device: [employer]ips Affiniti 50G/GE Logiq Images permanently stored and available for review in PACS Verbal informed consent obtained.  Discussed risks and benefits of procedure. Warned about infection, bleeding, hyperglycemia damage to structures among others. Patient expresses understanding and agreement Time-out conducted.   Noted no overlying erythema, induration, or other signs of local infection.   Skin prepped in a sterile fashion.   Local anesthesia: Topical Ethyl chloride.   With sterile technique and under real time ultrasound guidance: 40 mg of Kenalog and 2 mL of Marcaine injected into knee joint. Fluid seen entering the joint capsule.   Completed without difficulty   Pain immediately resolved  suggesting accurate placement of the medication.   Advised to call if fevers/chills, erythema, induration, drainage, or persistent bleeding.   Images permanently stored and available for review in the ultrasound unit.  Impression: Technically successful ultrasound guided injection.   Procedure: Real-time Ultrasound Guided Injection of right knee joint superior lateral patella space Device: [employer] Affiniti 50G/GE Logiq Images permanently stored and available for review in PACS Verbal informed consent obtained.  Discussed risks and benefits of procedure. Warned about infection, bleeding, hyperglycemia damage to structures among others. Patient expresses understanding and agreement Time-out conducted.   Noted no overlying erythema, induration, or other signs of local infection.   Skin prepped in a sterile fashion.   Local anesthesia: Topical Ethyl chloride.   With sterile technique and under real time ultrasound guidance: 40 mg of Kenalog and 2 mL of Marcaine injected into knee joint. Fluid seen entering the joint capsule.   Completed without difficulty   Pain immediately resolved suggesting accurate placement of the medication.   Advised to call if fevers/chills, erythema, induration, drainage, or persistent bleeding.   Images permanently stored and available for review in the ultrasound unit.  Impression: Technically successful ultrasound guided injection.        Assessment and Plan: 58 y.o. 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  Plan for steroid injection both knees today.  Work on authorization for hyaluronic acid for both knees.  Anticipate doing these that early July if his pain has returned that soon.   PDMP not reviewed this encounter.  Orders Placed This Encounter  Procedures   [place]  LIMITED JOINT SPACE STRUCTURES LOW LEFT(NO LINKED CHARGES)    Reason for Exam (SYMPTOM  OR DIAGNOSIS REQUIRED):   left knee pain    Preferred imaging location?:    [facility]   No orders of the defined types were placed in this encounter.    Discussed warning signs or symptoms. Please see discharge instructions. Patient expresses understanding.   The above documentation has been reviewed and is accurate and complete Garlan Juniper, M.D.

## 2024-03-25 ENCOUNTER — Ambulatory Visit (INDEPENDENT_AMBULATORY_CARE_PROVIDER_SITE_OTHER): Admitting: Family Medicine

## 2024-03-25 ENCOUNTER — Ambulatory Visit: Payer: Self-pay

## 2024-03-25 ENCOUNTER — Telehealth: Payer: Self-pay

## 2024-03-25 ENCOUNTER — Ambulatory Visit (INDEPENDENT_AMBULATORY_CARE_PROVIDER_SITE_OTHER): Admitting: Medical

## 2024-03-25 VITALS — BP 132/86 | HR 64 | Ht 72.0 in | Wt 244.0 lb

## 2024-03-25 VITALS — BP 118/74 | HR 68 | Temp 98.2°F | Resp 18 | Ht 72.0 in | Wt 246.0 lb

## 2024-03-25 DIAGNOSIS — M25562 Pain in left knee: Secondary | ICD-10-CM

## 2024-03-25 DIAGNOSIS — G8929 Other chronic pain: Secondary | ICD-10-CM | POA: Diagnosis not present

## 2024-03-25 DIAGNOSIS — M170 Bilateral primary osteoarthritis of knee: Secondary | ICD-10-CM | POA: Diagnosis not present

## 2024-03-25 DIAGNOSIS — R140 Abdominal distension (gaseous): Secondary | ICD-10-CM | POA: Diagnosis not present

## 2024-03-25 DIAGNOSIS — M25561 Pain in right knee: Secondary | ICD-10-CM

## 2024-03-25 DIAGNOSIS — K219 Gastro-esophageal reflux disease without esophagitis: Secondary | ICD-10-CM | POA: Diagnosis not present

## 2024-03-25 MED ORDER — OMEPRAZOLE 20 MG PO CPDR: 20.0000 mg | DELAYED_RELEASE_CAPSULE | Freq: Every day | ORAL | 0 refills | Status: AC

## 2024-03-25 NOTE — Telephone Encounter (Signed)
 Please re-auth gel shots, BILAT knees (previously had Orthovisc)

## 2024-03-25 NOTE — Patient Instructions (Signed)
 Gastroesophageal reflux disease (GERD) Chronic GERD with recent exacerbation. Partial relief with famotidine . Consideration for EGD due to long-standing symptoms and intermittent medication use. Discussed medication options and dietary modifications. - Continue famotidine  as needed. - Prescribe omeprazole  30 tablets as backup. - Refer to GI specialist for evaluation and potential EGD. - Advise dietary modifications: avoid fried foods, caffeine, chocolate, greasy foods, and carbohydrates(breads) - Instruct to report if heartburn becomes daily or if medications fail.  Follow up date to be determined. In one month if not able to get in with GI MD by then.

## 2024-03-25 NOTE — Patient Instructions (Addendum)
 Thank you for coming in today.   You received an injection today. Seek immediate medical attention if the joint becomes red, extremely painful, or is oozing fluid.   We will work to re-authorize the gel shots.  Return mid-July

## 2024-03-25 NOTE — Progress Notes (Signed)
 Subjective:    Patient ID: Cody Castro, male    DOB: [birth year], 58 y.o.   MRN: [id number]  HPI  Discussed the use of AI scribe software for clinical note transcription with the patient, who gave verbal consent to proceed.  History of Present Illness   Cody Castro is a 58 year old male who presents with chronic heartburn and bloating.  He has experienced heartburn for the past seven to eight years, with symptoms becoming more frequent and bothersome over the last four to five weeks. The sensation is described as a burning feeling, often occurring when lying down, and is accompanied by an acid or sour taste. The heartburn is intermittent but can affect his mood when persistent.  He uses famotidine  as needed for symptom relief, typically taking it three times a week, and sometimes requiring two doses. He has previously used omeprazole  for a couple of months when initially diagnosed, but not recently. He has not been on a consistent daily regimen for heartburn management.  He experiences bloating, particularly after consuming bread, such as a biscuit, which he had at 9 o'clock. This bloating occurs frequently and is associated with his dietary intake.  He underwent a colonoscopy in January 2021. No recent use of omeprazole , but he has used famotidine  as needed.        Review of Systems  Constitutional:  Negative for chills, fatigue and fever.  Respiratory:  Negative for cough, chest tightness, wheezing and stridor.   Cardiovascular:  Negative for chest pain and palpitations.  Gastrointestinal:  Positive for abdominal pain. Negative for abdominal distention, blood in stool, constipation and diarrhea.  Musculoskeletal:  Negative for back pain and myalgias.  Skin:  Negative for rash.  Neurological:  Negative for dizziness, weakness, numbness and headaches.  Hematological:  Negative for adenopathy. Does not bruise/bleed easily.  Psychiatric/Behavioral:  Negative for behavioral problems and  decreased concentration. The patient is not nervous/anxious.     Past Medical History:  Diagnosis Date   Vitiligo      Social History   Socioeconomic History   Marital status: Married    Spouse name: Not on file   Number of children: Not on file   Years of education: Not on file   Highest education level: Bachelor's degree (e.g., BA, AB, BS)  Occupational History   Occupation: [employer]  w/s  Tobacco Use   Smoking status: Never   Smokeless tobacco: Never  Vaping Use   Vaping status: Never Used  Substance and Sexual Activity   Alcohol use: Yes    Alcohol/week: 14.0 standard drinks of alcohol    Types: 14 drink(s) per week   Drug use: No   Sexual activity: Yes    Partners: Female  Other Topics Concern   Not on file  Social History Narrative   Exercise--  Walking/ jogging   Social Drivers of Health   Financial Resource Strain: Low Risk  (03/24/2024)   Overall Financial Resource Strain (CARDIA)    Difficulty of Paying Living Expenses: Not very hard  Food Insecurity: No Food Insecurity (03/24/2024)   Hunger Vital Sign    Worried About Running Out of Food in the Last Year: Never true    Ran Out of Food in the Last Year: Never true  Transportation Needs: No Transportation Needs (03/24/2024)   PRAPARE - Administrator, Civil Service (Medical): No    Lack of Transportation (Non-Medical): No  Physical Activity: Insufficiently Active (03/24/2024)   Exercise Vital Sign  Days of Exercise per Week: 4 days    Minutes of Exercise per Session: 30 min  Stress: No Stress Concern Present (03/24/2024)   [employer] of Occupational Health - Occupational Stress Questionnaire    Feeling of Stress : Only a little  Social Connections: Moderately Isolated (03/24/2024)   Social Connection and Isolation Panel [NHANES]    Frequency of Communication with Friends and Family: Twice a week    Frequency of Social Gatherings with Friends and Family: Never    Attends Religious  Services: 1 to 4 times per year    Active Member of [employer] or Organizations: No    Attends Engineer, structural: Not on file    Marital Status: Married  Catering manager Violence: Not on file    No past surgical history on file.  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 180 tablet 1   sildenafil  (VIAGRA ) 100 MG tablet Take 0.5-1 tablets (50-100 mg total) by mouth daily as needed for erectile dysfunction. 10 tablet 3   Current Facility-Administered Medications on File Prior to Visit  Medication Dose Route Frequency Provider Last Rate Last Admin   alum & mag hydroxide-simeth (MAALOX/MYLANTA) 200-200-20 MG/5ML suspension 30 mL  30 mL Oral Once Elizar Alpern, PA-C       hyoscyamine  (LEVSIN SL) SL tablet 0.125 mg  0.125 mg Oral Once Jorgina Binning, PA-C       lidocaine  (XYLOCAINE ) 2 % viscous mouth solution 15 mL  15 mL Mouth/Throat Once Aldora Perman, PA-C        BP 118/74   Pulse 68   Temp 98.2 F (36.8 C)   Resp 18   Ht 6' (1.829 m)   Wt 246 lb (111.6 kg)   SpO2 99%   BMI 33.36 kg/m        Objective:   Physical Exam   General Mental Status- Alert. General Appearance- Not in acute distress.   Skin General: Color- Normal Color. Moisture- Normal Moisture.  Neck Carotid Arteries-  Normal color. Moisture- Normal Moisture. No carotid bruits. No JVD.  Chest and Lung Exam Auscultation: Breath Sounds:-Normal.  Cardiovascular Auscultation:Rythm- Regular. Murmurs & Other Heart Sounds:Auscultation of the heart reveals- No Murmurs.  Abdomen Inspection:-Inspeection Normal. Palpation/Percussion:Note:No mass. Palpation and Percussion of the abdomen reveal- Non Tender, Non Distended + BS, no rebound or guarding.   Neurologic Cranial Nerve exam:- CN III-XII intact(No nystagmus), symmetric smile. Strength:- 5/5 equal and symmetric strength both upper and lower extremities.      Assessment & Plan:   Patient Instructions  Gastroesophageal reflux disease (GERD) Chronic GERD with recent exacerbation. Partial relief with famotidine . Consideration for EGD due to  long-standing symptoms and intermittent medication use. Discussed medication options and dietary modifications. - Continue famotidine  as needed. - Prescribe omeprazole  30 tablets as backup. - Refer to GI specialist for evaluation and potential EGD. - Advise dietary modifications: avoid fried foods, caffeine, chocolate, greasy foods, and carbohydrates(breads) - Instruct to report if heartburn becomes daily or if medications fail.  Follow up date to be determined. In one month if not able to get in with GI MD by then.

## 2024-03-25 NOTE — Telephone Encounter (Signed)
 Ran for benefits Case [phone]

## 2024-03-27 NOTE — Telephone Encounter (Signed)
 Reran for benefits and documented in a different encounter.

## 2024-03-28 NOTE — Telephone Encounter (Signed)
 Can you please schedule patient when medication is stocked.   Orthovisc authorized for bilateral knee Deductible $2250 met $0 OOP MAX $4000 has met $0 Once deductible has been met patient is responsible for coinsurance  Once the OOP has been met patient is covered at 10% PA is required Authorization is on file Auth # [id number] 08/29/23-08/28/24 approved for 6 units Reference number availity [id number] F. #[id number]

## 2024-04-05 NOTE — Telephone Encounter (Signed)
 Patient will call back to scheduled when needed.

## 2024-06-13 ENCOUNTER — Encounter: Payer: Self-pay | Admitting: Medical

## 2024-06-13 ENCOUNTER — Ambulatory Visit (INDEPENDENT_AMBULATORY_CARE_PROVIDER_SITE_OTHER): Admitting: Medical

## 2024-06-13 VITALS — BP 130/82 | HR 77 | Temp 98.5°F | Resp 16 | Ht 72.0 in | Wt 237.8 lb

## 2024-06-13 DIAGNOSIS — R21 Rash and other nonspecific skin eruption: Secondary | ICD-10-CM

## 2024-06-13 DIAGNOSIS — T7840XA Allergy, unspecified, initial encounter: Secondary | ICD-10-CM

## 2024-06-13 DIAGNOSIS — L089 Local infection of the skin and subcutaneous tissue, unspecified: Secondary | ICD-10-CM

## 2024-06-13 DIAGNOSIS — M2550 Pain in unspecified joint: Secondary | ICD-10-CM

## 2024-06-13 MED ORDER — DOXYCYCLINE HYCLATE 100 MG PO TABS: 100.0000 mg | ORAL_TABLET | Freq: Two times a day (BID) | ORAL | 0 refills | Status: DC

## 2024-06-13 MED ORDER — METHYLPREDNISOLONE 4 MG PO TABS: ORAL_TABLET | ORAL | 0 refills | Status: DC

## 2024-06-13 NOTE — Patient Instructions (Signed)
 Widespread pruritic rash with blistering  probable allergic reaction with secondary skin infection Rash with blistering and yellow discharge. Initial improvement with Augmentin , but not resolved. Recent yard work(poisin ivy?) and soap change may contribute. - Discontinue Augmentin , start doxycycline . - Prescribe Medrol  Dosepak for six days for inflammation and itching. - Obtain wound culture from area with yellow discharge left medial calf/ankle area. - Advise to avoid new soap, revert to previous soap. - Instruct to take doxycycline  with food, avoid sun exposure and calcium intake around medication time.  Swelling of right middle finger joint Swelling and tightness in right middle finger joint without known injury. Medrol  may reduce inflammation. - Advise to report increased swelling, redness, or tenderness. - Consider inflammatory lab studies if other joints swell or symptoms persist.   Follow up in 10 days or sooner if needed

## 2024-06-13 NOTE — Progress Notes (Signed)
 Subjective:    Patient ID: Cody Castro, male    DOB: 1966-09-10, 58 y.o.   MRN: [id number]  HPI Cody Castro is a 58 year old male who presents with a spreading rash and itching.  The rash began with what he initially thought was a bite on his right leg. It was initially suspected to be a bite or a fungal infection, and he was treated with augmentin  antibiotic at urgent care. However, the rash has since spread to multiple areas including behind his knees, under his arms, around his groin, and on his right forearm. The rash starts with itching, followed by the appearance of water blisters, which then scar. The rash on his right pretibial area initially had a black mark in center and in the surrounding area region turned red around.  He has been using witch hazel for treatment and experiences intense itching, especially at night, leading to scratching. There has been a yellow discharge from the left medial ankle and calf area with surrounding pink rash. . The rash has also appeared on his head, causing itching. The rash does not itch at the site of the blisters but around them.  He recalls doing yard work about ten days ago and feeling like something stung him, but he did not notice any bites. He has recently changed to a cheaper soap, which he suspects might be related to the rash. Also admits/suspects may have been exposed to poison ivy. No family history of similar rashes or itching.  He reports swelling and tightness in the right middle joint of the third digit, which appeared about a week ago without any injury. No other joint pain or swelling.  He is currently taking Augmentin , which was prescribed at urgent care, but has not seen complete resolution of symptoms.   Review of Systems  Constitutional:  Negative for chills, fatigue and fever.  Respiratory:  Negative for cough, chest tightness and wheezing.   Cardiovascular:  Negative for chest pain and palpitations.  Gastrointestinal:  Negative  for abdominal pain and blood in stool.  Musculoskeletal:  Negative for back pain.  Skin:  Positive for rash.  Neurological:  Negative for dizziness, weakness and light-headedness.  Hematological:  Negative for adenopathy.  Psychiatric/Behavioral:  Negative for behavioral problems, decreased concentration and dysphoric mood.     Past Medical History:  Diagnosis Date   Vitiligo      Social History   Socioeconomic History   Marital status: Married    Spouse name: Not on file   Number of children: Not on file   Years of education: Not on file   Highest education level: Bachelor's degree (e.g., BA, AB, BS)  Occupational History   Occupation: [employer]  w/s  Tobacco Use   Smoking status: Never   Smokeless tobacco: Never  Vaping Use   Vaping status: Never Used  Substance and Sexual Activity   Alcohol use: Yes    Alcohol/week: 14.0 standard drinks of alcohol    Types: 14 drink(s) per week   Drug use: No   Sexual activity: Yes    Partners: Female  Other Topics Concern   Not on file  Social History Narrative   Exercise--  Walking/ jogging   Social Drivers of Health   Financial Resource Strain: Low Risk  (06/12/2024)   Overall Financial Resource Strain (CARDIA)    Difficulty of Paying Living Expenses: Not hard at all  Food Insecurity: No Food Insecurity (06/12/2024)   Hunger Vital Sign  Worried About Programme researcher, broadcasting/film/video in the Last Year: Never true    Ran Out of Food in the Last Year: Never true  Transportation Needs: No Transportation Needs (06/12/2024)   PRAPARE - Administrator, Civil Service (Medical): No    Lack of Transportation (Non-Medical): No  Physical Activity: Insufficiently Active (06/12/2024)   Exercise Vital Sign    Days of Exercise per Week: 4 days    Minutes of Exercise per Session: 30 min  Stress: No Stress Concern Present (06/12/2024)   [employer] of Occupational Health - Occupational Stress Questionnaire    Feeling of Stress: Only a  little  Social Connections: Moderately Isolated (06/12/2024)   Social Connection and Isolation Panel    Frequency of Communication with Friends and Family: Once a week    Frequency of Social Gatherings with Friends and Family: Never    Attends Religious Services: 1 to 4 times per year    Active Member of [employer] or Organizations: No    Attends Engineer, structural: Not on file    Marital Status: Married  Catering manager Violence: Not on file    No past surgical history on file.  Family History  Problem Relation Age of Onset   Hypertension Mother    Depression Mother    Depression Sister    Hypertension Sister    Heart disease Paternal Grandfather    Parkinsonism Paternal Grandfather    Alzheimer's disease Paternal Grandfather    Deep vein thrombosis Paternal Grandfather    Cancer Maternal Aunt 30       breast   Colon cancer Neg Hx    Esophageal cancer Neg Hx    Rectal cancer Neg Hx    Stomach cancer Neg Hx     Allergies  Allergen Reactions   Aspirin Other (See Comments)    Duodenal Ulcer @ age 53    Current Outpatient Medications on File Prior to Visit  Medication Sig Dispense Refill   amoxicillin -clavulanate (AUGMENTIN ) 875-125 MG tablet Take 1 tablet by mouth 2 (two) times daily.     famotidine  (PEPCID ) 20 MG tablet Take 1 tablet (20 mg total) by mouth 2 (two) times daily. 180 tablet 1   omeprazole  (PRILOSEC) 20 MG capsule Take 1 capsule (20 mg total) by mouth daily. 30 capsule 0   sildenafil  (VIAGRA ) 100 MG tablet Take 0.5-1 tablets (50-100 mg total) by mouth daily as needed for erectile dysfunction. 10 tablet 3   Current Facility-Administered Medications on File Prior to Visit  Medication Dose Route Frequency Provider Last Rate Last Admin   alum & mag hydroxide-simeth (MAALOX/MYLANTA) 200-200-20 MG/5ML suspension 30 mL  30 mL Oral Once Tharun Cappella, PA-C       hyoscyamine  (LEVSIN  SL) SL tablet 0.125 mg  0.125 mg Oral Once Froilan Mclean, PA-C        lidocaine  (XYLOCAINE ) 2 % viscous mouth solution 15 mL  15 mL Mouth/Throat Once Kayliah Tindol, PA-C        BP 130/82   Pulse 77   Temp 98.5 F (36.9 C) (Oral)   Resp 16   Ht 6' (1.829 m)   Wt 237 lb 12.8 oz (107.9 kg)   SpO2 96%   BMI 32.25 kg/m          Objective:   Physical Exam  General Mental Status- Alert. General Appearance- Not in acute distress.   Skin Rt pretibal small scab with faint redness. No warmth or dc. (Much lighter red compared  to picture he shows me before augmentin .) Left lower ext- medial lower calf small 8mm sized approximate wond with surrounding redness. No dc. -rt forearm small papular rash present.  -rt side forearm. Faint red rash rt side hairline.  Neck  No JVD.  Chest and Lung Exam Auscultation: Breath Sounds:-Normal.  Cardiovascular Auscultation:Rythm- Regular. Murmurs & Other Heart Sounds:Auscultation of the heart reveals- No Murmurs.  Abdomen Inspection:-Inspeection Normal. Palpation/Percussion:Note:No mass. Palpation and Percussion of the abdomen reveal- Non Tender, Non Distended + BS, no rebound or guarding.   Neurologic Cranial Nerve exam:- CN III-XII intact(No nystagmus), symmetric smile. symetric strength both upper and lower extremities.       Assessment & Plan:   Patient Instructions  Widespread pruritic rash with blistering  probable allergic reaction with secondary skin infection Rash with blistering and yellow discharge. Initial improvement with Augmentin , but not resolved. Recent yard work(poisin ivy?) and soap change may contribute. - Discontinue Augmentin , start doxycycline . - Prescribe Medrol  Dosepak for six days for inflammation and itching. - Obtain wound culture from area with yellow discharge left medial calf/ankle area. - Advise to avoid new soap, revert to previous soap. - Instruct to take doxycycline  with food, avoid sun exposure and calcium intake around medication time.  Swelling of right middle  finger joint Swelling and tightness in right middle finger joint without known injury. Medrol  may reduce inflammation. - Advise to report increased swelling, redness, or tenderness. - Consider inflammatory lab studies if other joints swell or symptoms persist.   Follow up in 10 days or sooner if needed    Lyman Balingit, PA-C

## 2024-06-16 ENCOUNTER — Ambulatory Visit: Payer: Self-pay | Admitting: Medical

## 2024-06-16 LAB — WOUND CULTURE
MICRO NUMBER:: 16864552
SPECIMEN QUALITY:: ADEQUATE

## 2024-06-17 MED ORDER — PERMETHRIN 5 % EX CREA: TOPICAL_CREAM | CUTANEOUS | 0 refills | Status: AC

## 2024-06-17 NOTE — Addendum Note (Signed)
 Addended by: DORINA DALLAS HERO on: 06/17/2024 01:06 PM   Modules accepted: Orders

## 2024-06-17 NOTE — Addendum Note (Signed)
 Addended by: DORINA DALLAS HERO on: 06/17/2024 01:09 PM   Modules accepted: Orders

## 2024-06-18 ENCOUNTER — Telehealth: Payer: Self-pay

## 2024-06-18 NOTE — Telephone Encounter (Signed)
  Mychart message sent to PCP  Copied from CRM #[id number]. Topic: Clinical - Medication Question >> Jun 18, 2024 10:39 AM [name] wrote: Reason for CRM: Patient's wife Cody Castro) is calling because she found out his rash (possible scabies) is highly contagious and was wondering if her and her son could also be prescribed the permethrin  (ELIMITE ) 5 % cream by today. At the time of the appointment, it was never offered for them.   (Both patient's wife (MRN: [id number]) and son ([name] *non established patient*) are asymptomatic.)  Please call: [phone] / Patient's wife is aware of the same day turn around time.

## 2024-06-19 ENCOUNTER — Ambulatory Visit: Admitting: Medical

## 2024-06-19 ENCOUNTER — Encounter: Payer: Self-pay | Admitting: *Deleted

## 2024-06-19 ENCOUNTER — Ambulatory Visit (INDEPENDENT_AMBULATORY_CARE_PROVIDER_SITE_OTHER): Admitting: Medical

## 2024-06-19 ENCOUNTER — Encounter: Payer: Self-pay | Admitting: Medical

## 2024-06-19 VITALS — BP 130/70 | HR 59 | Temp 97.9°F | Resp 16 | Ht 72.0 in | Wt 240.0 lb

## 2024-06-19 DIAGNOSIS — R21 Rash and other nonspecific skin eruption: Secondary | ICD-10-CM

## 2024-06-19 MED ORDER — METHYLPREDNISOLONE 4 MG PO TABS: 4.0000 mg | ORAL_TABLET | Freq: Every day | ORAL | 0 refills | Status: DC

## 2024-06-19 MED ORDER — HYDROXYZINE HCL 10 MG PO TABS: 10.0000 mg | ORAL_TABLET | Freq: Three times a day (TID) | ORAL | 0 refills | Status: DC | PRN

## 2024-06-19 MED ORDER — METHYLPREDNISOLONE SODIUM SUCC 125 MG IJ SOLR
125.0000 mg | Freq: Once | INTRAMUSCULAR | Status: AC
  Administered 2024-06-19: 125 mg via INTRAMUSCULAR

## 2024-06-19 MED ORDER — METHYLPREDNISOLONE SODIUM SUCC 125 MG IJ SOLR: 125.0000 mg | Freq: Once | INTRAMUSCULAR | Status: DC

## 2024-06-19 NOTE — Progress Notes (Addendum)
 Subjective:    Patient ID: Cody Castro, male    DOB: [birth year], 58 y.o.   MRN: [id number]  HPI   HPI from last visit below. Cody Castro is a 58 year old male who presents with a spreading rash and itching.   The rash began with what he initially thought was a bite on his right leg. It was initially suspected to be a bite or a fungal infection, and he was treated with augmentin  antibiotic at urgent care. However, the rash has since spread to multiple areas including behind his knees, under his arms, around his groin, and on his right forearm. The rash starts with itching, followed by the appearance of water blisters, which then scar. The rash on his right pretibial area initially had a black mark in center and in the surrounding area region turned red around.   He has been using witch hazel for treatment and experiences intense itching, especially at night, leading to scratching. There has been a yellow discharge from the left medial ankle and calf area with surrounding pink rash. . The rash has also appeared on his head, causing itching. The rash does not itch at the site of the blisters but around them.   He recalls doing yard work about ten days ago and feeling like something stung him, but he did not notice any bites. He has recently changed to a cheaper soap, which he suspects might be related to the rash. Also admits/suspects may have been exposed to poison ivy. No family history of similar rashes or itching.   He reports swelling and tightness in the right middle joint of the third digit, which appeared about a week ago without any injury. No other joint pain or swelling.   He is currently taking Augmentin , which was prescribed at urgent care, but has not seen complete resolution of symptoms.   Pt last visit AVS below in     Patient Instructions  Widespread pruritic rash with blistering  probable allergic reaction with secondary skin infection Rash with blistering and yellow  discharge. Initial improvement with Augmentin , but not resolved. Recent yard work(poisin ivy?) and soap change may contribute. - Discontinue Augmentin , start doxycycline . - Prescribe Medrol  Dosepak for six days for inflammation and itching. - Obtain wound culture from area with yellow discharge left medial calf/ankle area. - Advise to avoid new soap, revert to previous soap. - Instruct to take doxycycline  with food, avoid sun exposure and calcium intake around medication time.   Pt sent various message and he stated getting worse despite treatment above. So on further thought expanded differential to include scabies. Though on initial visit no family members described with rash. Particularly asked if wife had which she did not not and yet to date does not have any rash.  Since describes worsening have already placed dermatologist referral. That is being processed.    Review of Systems  Constitutional:  Negative for chills, fatigue and fever.  Respiratory:  Negative for cough, chest tightness and wheezing.   Cardiovascular:  Negative for chest pain and palpitations.  Gastrointestinal:  Negative for abdominal pain and blood in stool.  Musculoskeletal:  Negative for back pain.  Skin:  Positive for rash.  Neurological:  Negative for dizziness, weakness and light-headedness.  Hematological:  Negative for adenopathy.  Psychiatric/Behavioral:  Negative for behavioral problems, decreased concentration and dysphoric mood.     Past Medical History:  Diagnosis Date   Vitiligo      Social History   Socioeconomic  History   Marital status: Married    Spouse name: Not on file   Number of children: Not on file   Years of education: Not on file   Highest education level: Bachelor's degree (e.g., BA, AB, BS)  Occupational History   Occupation: [employer]  Tobacco Use   Smoking status: Never   Smokeless tobacco: Never  Vaping Use   Vaping status: Never Used  Substance and Sexual  Activity   Alcohol use: Yes    Alcohol/week: 14.0 standard drinks of alcohol    Types: 14 drink(s) per week   Drug use: No   Sexual activity: Yes    Partners: Female  Other Topics Concern   Not on file  Social History Narrative   Exercise--  Walking/ jogging   Social Drivers of Health   Financial Resource Strain: Low Risk  (06/12/2024)   Overall Financial Resource Strain (CARDIA)    Difficulty of Paying Living Expenses: Not hard at all  Food Insecurity: No Food Insecurity (06/12/2024)   Hunger Vital Sign    Worried About Running Out of Food in the Last Year: Never true    Ran Out of Food in the Last Year: Never true  Transportation Needs: No Transportation Needs (06/12/2024)   PRAPARE - Administrator, Civil Service (Medical): No    Lack of Transportation (Non-Medical): No  Physical Activity: Insufficiently Active (06/12/2024)   Exercise Vital Sign    Days of Exercise per Week: 4 days    Minutes of Exercise per Session: 30 min  Stress: No Stress Concern Present (06/12/2024)   [employer] of Occupational Health - Occupational Stress Questionnaire    Feeling of Stress: Only a little  Social Connections: Moderately Isolated (06/12/2024)   Social Connection and Isolation Panel    Frequency of Communication with Friends and Family: Once a week    Frequency of Social Gatherings with Friends and Family: Never    Attends Religious Services: 1 to 4 times per year    Active Member of [employer] or Organizations: No    Attends Engineer, structural: Not on file    Marital Status: Married  Catering manager Violence: Not on file    No past surgical history on file.  Family History  Problem Relation Age of Onset   Hypertension Mother    Depression Mother    Depression Sister    Hypertension Sister    Heart disease Paternal Grandfather    Parkinsonism Paternal Grandfather    Alzheimer's disease Paternal Grandfather    Deep vein thrombosis Paternal Grandfather     Cancer Maternal Aunt 30       breast   Colon cancer Neg Hx    Esophageal cancer Neg Hx    Rectal cancer Neg Hx    Stomach cancer Neg Hx     Allergies  Allergen Reactions   Aspirin Other (See Comments)    Duodenal Ulcer @ age 71    Current Outpatient Medications on File Prior to Visit  Medication Sig Dispense Refill   amoxicillin -clavulanate (AUGMENTIN ) 875-125 MG tablet Take 1 tablet by mouth 2 (two) times daily.     doxycycline  (VIBRA -TABS) 100 MG tablet Take 1 tablet (100 mg total) by mouth 2 (two) times daily. 20 tablet 0   famotidine  (PEPCID ) 20 MG tablet Take 1 tablet (20 mg total) by mouth 2 (two) times daily. 180 tablet 1   omeprazole  (PRILOSEC) 20 MG capsule Take 1 capsule (20 mg total) by  mouth daily. 30 capsule 0   permethrin  (ELIMITE ) 5 % cream Apply cream head to toe, leave for 8-14 hours prior to washing with soap/water.  Repeat in 14 days if needed 60 g 0   sildenafil  (VIAGRA ) 100 MG tablet Take 0.5-1 tablets (50-100 mg total) by mouth daily as needed for erectile dysfunction. 10 tablet 3   Current Facility-Administered Medications on File Prior to Visit  Medication Dose Route Frequency Provider Last Rate Last Admin   alum & mag hydroxide-simeth (MAALOX/MYLANTA) 200-200-20 MG/5ML suspension 30 mL  30 mL Oral Once Azzan Butler, PA-C       hyoscyamine  (LEVSIN  SL) SL tablet 0.125 mg  0.125 mg Oral Once Aishah Teffeteller, PA-C       lidocaine  (XYLOCAINE ) 2 % viscous mouth solution 15 mL  15 mL Mouth/Throat Once Kyllie Pettijohn, PA-C        BP 130/70   Pulse (!) 59   Temp 97.9 F (36.6 C) (Oral)   Resp 16   Ht 6' (1.829 m)   Wt 240 lb (108.9 kg)   SpO2 95%   BMI 32.55 kg/m        Objective:   Physical Exam  General Mental Status- Alert. General Appearance- Not in acute distress.    Skin Rt pretibal small scab with more prominent red. No warmth or dc.  Left lower ext- medial lower calf small 8mm sized approximate wond with worse surrounding redness. No  dc. -rt forearm small papular rash present.  -rt side forearm. Faint red rash rt side hairline. Rt antecubital rash new. New rt antebutal and left calf rash.   Neck  No JVD.   Chest and Lung Exam Auscultation: Breath Sounds:-Normal.   Cardiovascular Auscultation:Rythm- Regular. Murmurs & Other Heart Sounds:Auscultation of the heart reveals- No Murmurs.   Abdomen Inspection:-Inspeection Normal. Palpation/Percussion:Note:No mass. Palpation and Percussion of the abdomen reveal- Non Tender, Non Distended + BS, no rebound or guarding.     Neurologic Cranial Nerve exam:- CN III-XII intact(No nystagmus), symmetric smile. symetric strength both upper and lower extremities.       Assessment & Plan:  Recent pruritic rash(initially thought allergic reaction) with secondary infection worsened despite initial Medrol  and doxycycline . New lesions on right elbow and behind knees. Differential includes allergic reaction, secondary infection, and unlikely scabies. Dermatology referral pending. May need skin biopsy? - Administer Solu-Medrol  40 mg injection. - Additinoal Medrol  4 mg daily for 7 days. - Prescribe hydroxyzine  10 mg every 8 hours as needed for itching, 2 tablets at night if needed. Warned of drowsiness. - Continue doxycycline  as previously prescribed. - Attempt to expedite dermatology referral for next week, earlier if possible. - Advised to send MyChart message if condition worsens by Friday morning.  Follow up date to be determined. Still working on trying to expedite dermatology referral.  Dallas Maxwell, PA-C

## 2024-06-19 NOTE — Addendum Note (Signed)
 Addended by: DORINA DALLAS HERO on: 06/19/2024 08:52 PM   Modules accepted: Level of Service

## 2024-06-19 NOTE — Patient Instructions (Addendum)
 Recent pruritic rash(initially thought allergic reaction) with secondary infection worsened despite initial Medrol  and doxycycline . New lesions on right elbow and behind knees. Differential includes allergic reaction, secondary infection, and unlikely scabies. Dermatology referral pending. May need skin biopsy? - Administer Solu-Medrol  40 mg injection. - Additinoal Medrol  4 mg daily for 7 days. - Prescribe hydroxyzine  10 mg every 8 hours as needed for itching, 2 tablets at night if needed. Warned of drowsiness. - Continue doxycycline  as previously prescribed. - Attempt to expedite dermatology referral for next week, earlier if possible. - Advised to send MyChart message if condition worsens by Friday morning.  Follow up date to be determined. Still working on trying to expedite dermatology referral.

## 2024-06-19 NOTE — Addendum Note (Signed)
 Addended by: DORINA DALLAS HERO on: 06/19/2024 08:48 PM   Modules accepted: Level of Service

## 2024-07-26 NOTE — Telephone Encounter (Signed)
 I sent message to referral coordinator regarding this

## 2024-07-26 NOTE — Telephone Encounter (Signed)
 Patient has attempting to contact dermatologist four times and has been unsuccessful and has not received a phone call back. Patient states the medication prescribed by provider has improved his initial reason for the referral but going forward he would prefer the referral be sent to another dermatologist that's more focused on referral acknowledgement/patient care.

## 2024-10-18 ENCOUNTER — Encounter: Payer: Self-pay | Admitting: Family Medicine

## 2024-11-14 NOTE — Progress Notes (Unsigned)
"       ° °  LILLETTE Ileana Collet, PhD, LAT, ATC acting as a scribe for Artist Lloyd, MD.  Gibril Mastro is a 59 y.o. male who presents to [facility] today for exacerbation of his bilat knee pain. Pt was last seen by Dr. Lloyd on 03/25/24 and was given bilat knee steroid injections.  Today, pt reports L knee pain returned about Sept. Pain is only in the L knee. +swelling.   Dx imaging: 07/22/23 L knee MRI 04/12/23 R & L knee XR  Pertinent review of systems: No fevers or chills  Relevant historical information: Osteoarthritis of both knees.  Vitiligo.   Exam:  BP (!) 144/92   Pulse 64   Ht 6' (1.829 m)   Wt 243 lb (110.2 kg)   SpO2 97%   BMI 32.96 kg/m  General: Well Developed, well nourished, and in no acute distress.   MSK: Left knee mild effusion.  Normal-appearing otherwise.  Normal motion.    Lab and Radiology Results  Procedure: Real-time Ultrasound Guided Injection of left knee joint superior lateral patella space Device: Philips Affiniti 50G/GE Logiq Images permanently stored and available for review in PACS Verbal informed consent obtained.  Discussed risks and benefits of procedure. Warned about infection, bleeding, hyperglycemia damage to structures among others. Patient expresses understanding and agreement Time-out conducted.   Noted no overlying erythema, induration, or other signs of local infection.   Skin prepped in a sterile fashion.   Local anesthesia: Topical Ethyl chloride.   With sterile technique and under real time ultrasound guidance: 40 mg of Kenalog and 2 mL of Marcaine injected into knee joint. Fluid seen entering the joint capsule.   Completed without difficulty   Pain immediately resolved suggesting accurate placement of the medication.   Advised to call if fevers/chills, erythema, induration, drainage, or persistent bleeding.   Images permanently stored and available for review in the ultrasound unit.  Impression: Technically  successful ultrasound guided injection.       Assessment and Plan: 59 y.o. male with left knee pain due to exacerbation of DJD.  Plan for steroid injection today.  Check back as needed.   PDMP not reviewed this encounter. Orders Placed This Encounter  Procedures   US  LIMITED JOINT SPACE STRUCTURES LOW LEFT(NO LINKED CHARGES)    Reason for Exam (SYMPTOM  OR DIAGNOSIS REQUIRED):   eval knee pain    Preferred imaging location?:   [facility]   No orders of the defined types were placed in this encounter.    Discussed warning signs or symptoms. Please see discharge instructions. Patient expresses understanding.   The above documentation has been reviewed and is accurate and complete Artist Lloyd, M.D.   "

## 2024-11-15 ENCOUNTER — Ambulatory Visit: Admitting: Family Medicine

## 2024-11-15 VITALS — BP 144/92 | HR 64 | Ht 72.0 in | Wt 243.0 lb

## 2024-11-15 DIAGNOSIS — M25562 Pain in left knee: Secondary | ICD-10-CM | POA: Diagnosis not present

## 2024-11-15 DIAGNOSIS — M170 Bilateral primary osteoarthritis of knee: Secondary | ICD-10-CM | POA: Diagnosis not present

## 2024-11-15 DIAGNOSIS — G8929 Other chronic pain: Secondary | ICD-10-CM | POA: Diagnosis not present

## 2024-11-15 NOTE — Patient Instructions (Addendum)
 Thank you for coming in today.   You received an injection today. Seek immediate medical attention if the joint becomes red, extremely painful, or is oozing fluid.   Check back as needed

## 2024-12-13 ENCOUNTER — Encounter: Admitting: Medical
